# Patient Record
Sex: Female | Born: 1995 | Race: White | Hispanic: No | Marital: Single | Smoking: Never smoker
Health system: Southern US, Community
[De-identification: ages and names within clinical notes are randomized; demographics above are authoritative.]

## PROBLEM LIST (undated history)

## (undated) DIAGNOSIS — R55 Syncope and collapse: Secondary | ICD-10-CM

## (undated) DIAGNOSIS — R001 Bradycardia, unspecified: Secondary | ICD-10-CM

## (undated) DIAGNOSIS — N200 Calculus of kidney: Secondary | ICD-10-CM

## (undated) DIAGNOSIS — R110 Nausea: Secondary | ICD-10-CM

## (undated) HISTORY — DX: Syncope and collapse: R55

## (undated) HISTORY — DX: Bradycardia, unspecified: R00.1

## (undated) HISTORY — DX: Nausea: R11.0

---
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
## 2017-07-21 IMAGING — US US RENAL
1 series · 14 of 25 positions shown · non-contrast
Comparison: CT scan 01/23/2013

CLINICAL DATA: Bilateral flank pain, history of stones

EXAM:
RENAL / URINARY TRACT ULTRASOUND COMPLETE

[Series 1: us renal · 0.21mm/px · 14 of 46 slices shown]
[im 1/46]
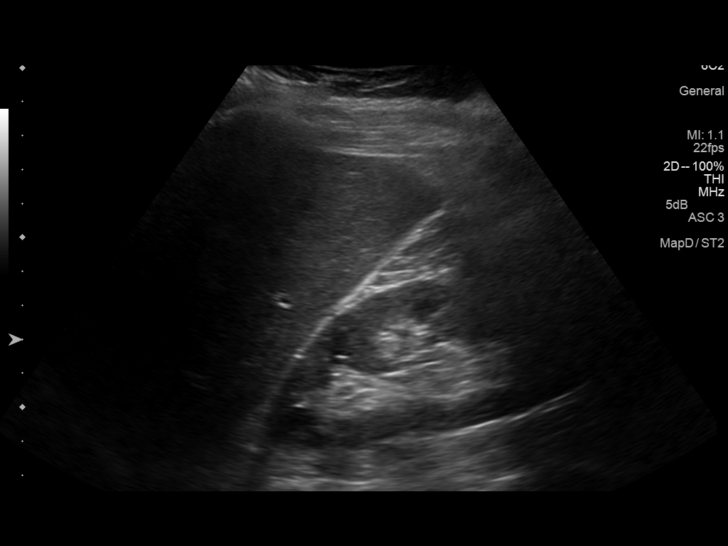
[im 4/46]
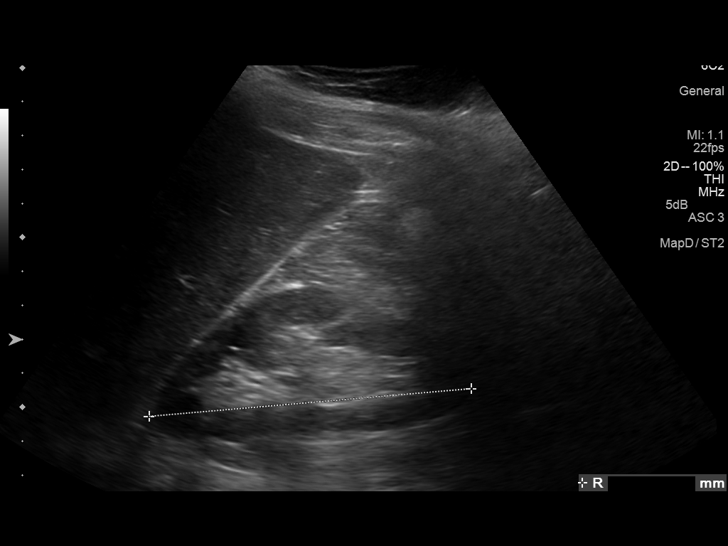
[im 8/46]
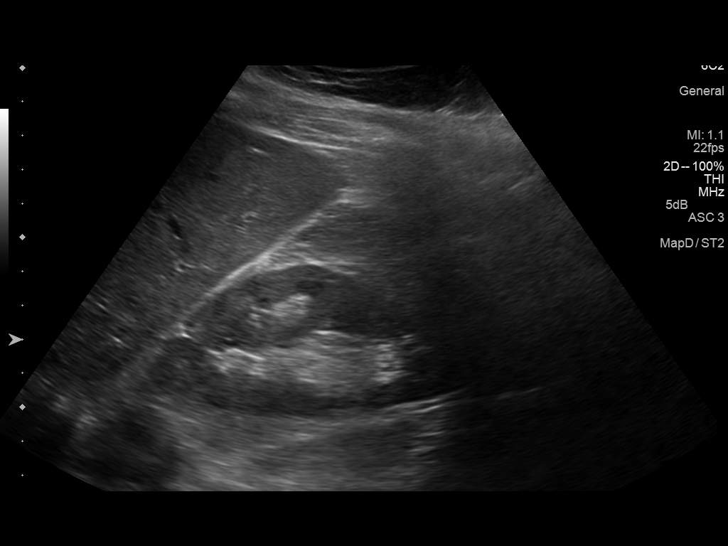
[im 12/46]
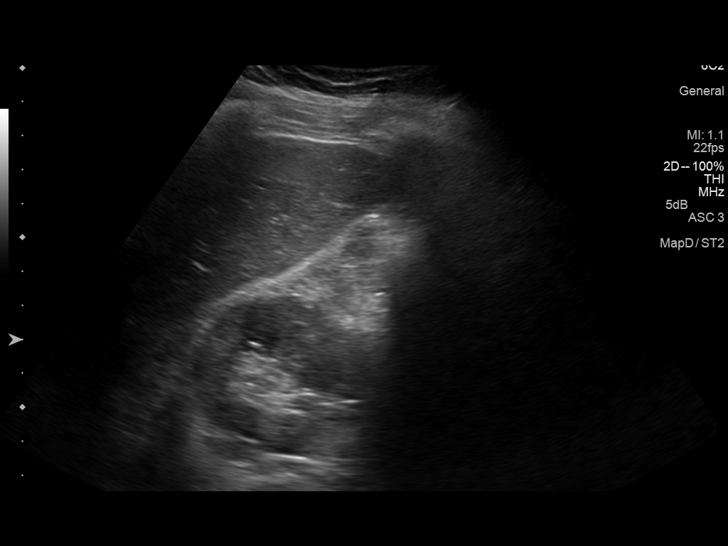
[im 16/46]
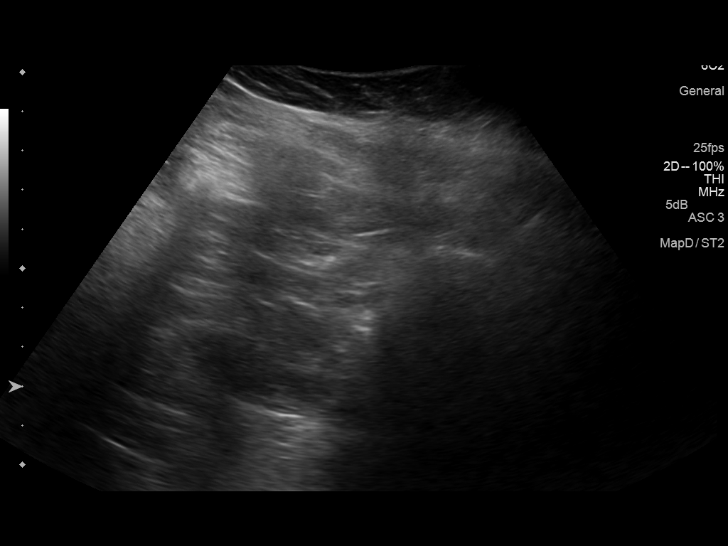
[im 17/46]
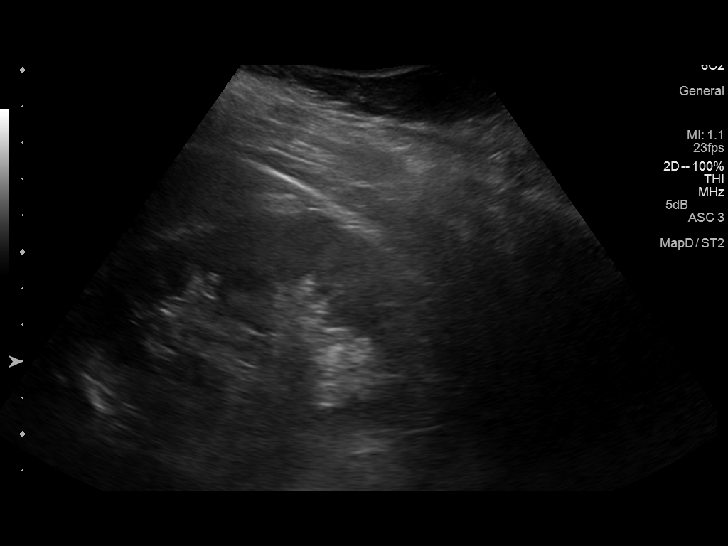
[im 21/46]
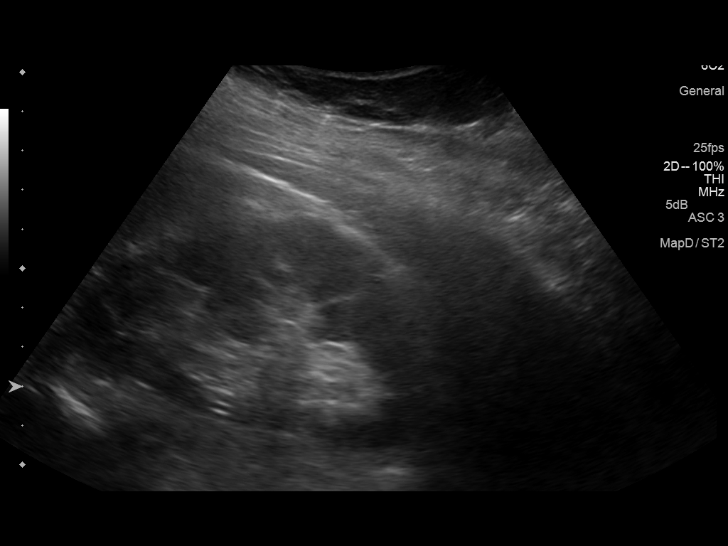
[im 25/46]
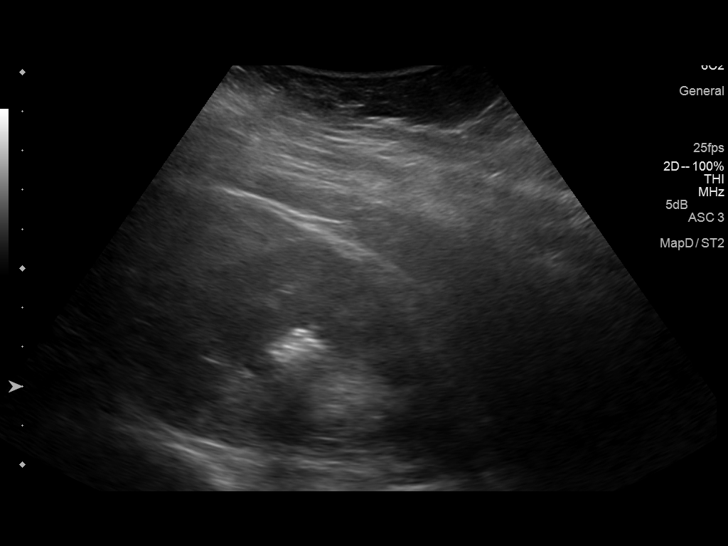
[im 29/46]
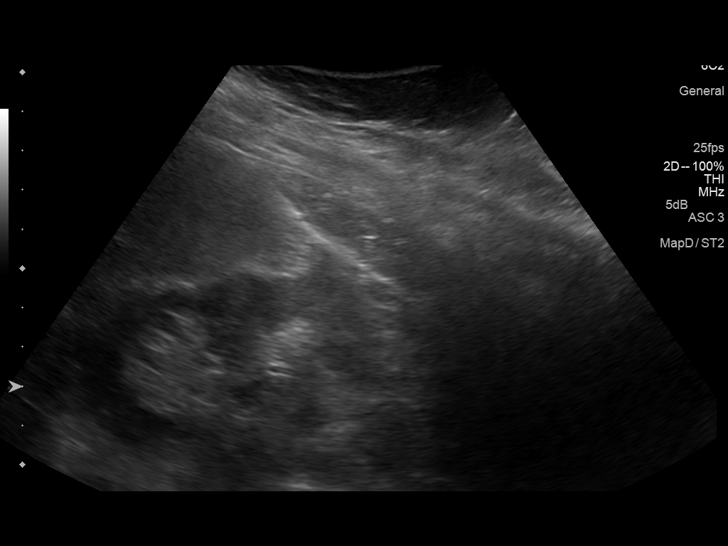
[im 31/46]
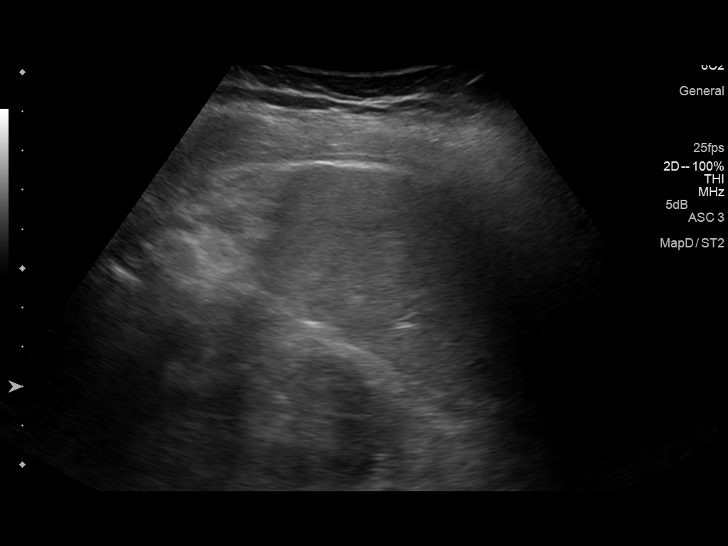
[im 34/46]
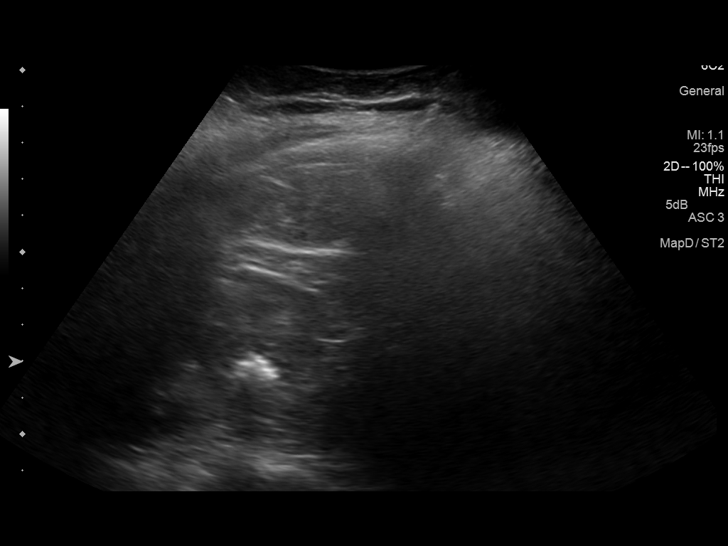
[im 38/46]
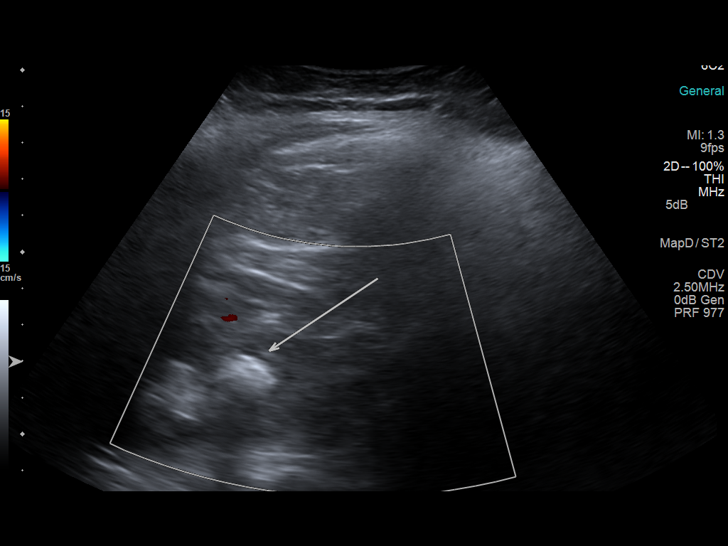
[im 42/46]
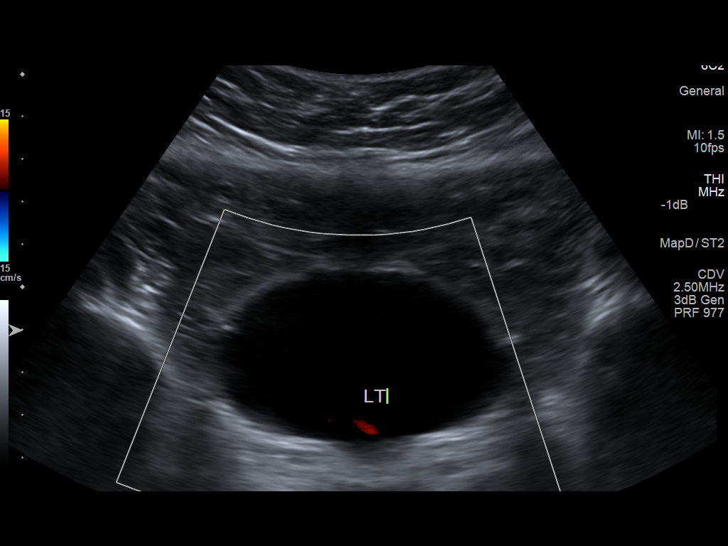
[im 46/46]
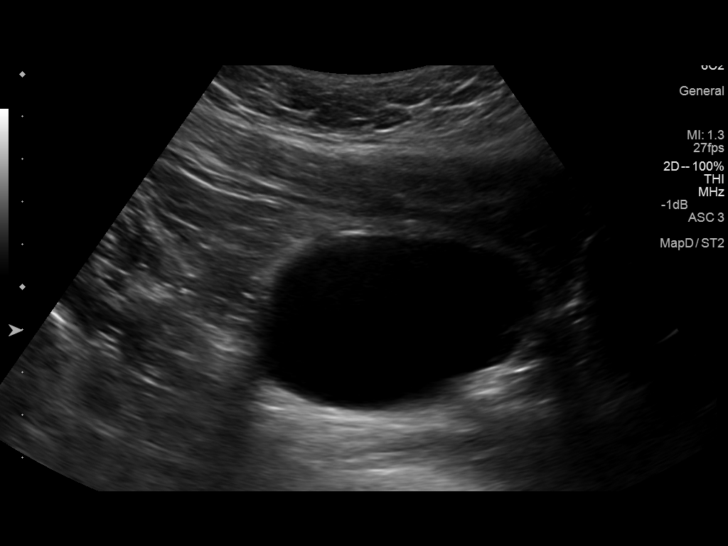

[14 of 25 positions shown; findings below may reference images not displayed]

FINDINGS: Right Kidney:

Length: 9.5 cm. Echogenicity within normal limits. No mass or
hydronephrosis visualized.

Left Kidney:

Length: 9.7 cm. Echogenicity within normal limits. No hydronephrosis
are noted. Calcified calculus midpole measures 1.4 by 1.6 cm.

Bladder:

Appears normal for degree of bladder distention.
IMPRESSION: No hydronephrosis.  Left nephrolithiasis.

## 2019-09-06 ENCOUNTER — Other Ambulatory Visit: Payer: Self-pay

## 2019-09-06 DIAGNOSIS — Z20822 Contact with and (suspected) exposure to covid-19: Secondary | ICD-10-CM

## 2019-09-08 LAB — NOVEL CORONAVIRUS, NAA: SARS-CoV-2, NAA: NOT DETECTED

## 2020-01-09 ENCOUNTER — Ambulatory Visit (INDEPENDENT_AMBULATORY_CARE_PROVIDER_SITE_OTHER): Payer: Self-pay | Admitting: Nurse Practitioner

## 2020-01-09 ENCOUNTER — Encounter: Payer: Self-pay | Admitting: Nurse Practitioner

## 2020-01-09 ENCOUNTER — Other Ambulatory Visit: Payer: Self-pay

## 2020-01-09 VITALS — BP 126/82 | HR 62 | Temp 98.2°F | Resp 18 | Ht 66.0 in | Wt 164.6 lb

## 2020-01-09 DIAGNOSIS — Z7689 Persons encountering health services in other specified circumstances: Secondary | ICD-10-CM

## 2020-01-09 DIAGNOSIS — Z13228 Encounter for screening for other metabolic disorders: Secondary | ICD-10-CM

## 2020-01-09 DIAGNOSIS — F4010 Social phobia, unspecified: Secondary | ICD-10-CM

## 2020-01-09 DIAGNOSIS — Z13220 Encounter for screening for lipoid disorders: Secondary | ICD-10-CM

## 2020-01-09 HISTORY — DX: Social phobia, unspecified: F40.10

## 2020-01-09 LAB — COMPLETE METABOLIC PANEL WITH GFR
AG Ratio: 1.8 (calc) (ref 1.0–2.5)
ALT: 9 U/L (ref 6–29)
AST: 14 U/L (ref 10–30)
Albumin: 4.4 g/dL (ref 3.6–5.1)
Alkaline phosphatase (APISO): 46 U/L (ref 31–125)
BUN: 9 mg/dL (ref 7–25)
CO2: 26 mmol/L (ref 20–32)
Calcium: 9.7 mg/dL (ref 8.6–10.2)
Chloride: 105 mmol/L (ref 98–110)
Creat: 0.72 mg/dL (ref 0.50–1.10)
GFR, Est African American: 139 mL/min/{1.73_m2} (ref >=60)
GFR, Est Non African American: 120 mL/min/{1.73_m2} (ref >=60)
Globulin: 2.5 g/dL (calc) (ref 1.9–3.7)
Glucose, Bld: 95 mg/dL (ref 65–99)
Potassium: 4.2 mmol/L (ref 3.5–5.3)
Sodium: 140 mmol/L (ref 135–146)
Total Bilirubin: 0.6 mg/dL (ref 0.2–1.2)
Total Protein: 6.9 g/dL (ref 6.1–8.1)

## 2020-01-09 LAB — LIPID PANEL
Cholesterol: 172 mg/dL (ref <200)
HDL: 69 mg/dL (ref >=50)
LDL Cholesterol (Calc): 86 mg/dL (calc)
Non-HDL Cholesterol (Calc): 103 mg/dL (calc) (ref <130)
Total CHOL/HDL Ratio: 2.5 (calc) (ref <5.0)
Triglycerides: 78 mg/dL (ref <150)

## 2020-01-09 LAB — CBC WITH DIFFERENTIAL/PLATELET
Absolute Monocytes: 459 cells/uL (ref 200–950)
Basophils Absolute: 50 cells/uL (ref 0–200)
Basophils Relative: 0.8 %
Eosinophils Absolute: 198 cells/uL (ref 15–500)
Eosinophils Relative: 3.2 %
HCT: 40.4 % (ref 35.0–45.0)
Hemoglobin: 13.6 g/dL (ref 11.7–15.5)
Lymphs Abs: 1810 cells/uL (ref 850–3900)
MCH: 30.6 pg (ref 27.0–33.0)
MCHC: 33.7 g/dL (ref 32.0–36.0)
MCV: 91 fL (ref 80.0–100.0)
MPV: 12.6 fL — ABNORMAL HIGH (ref 7.5–12.5)
Monocytes Relative: 7.4 %
Neutro Abs: 3683 cells/uL (ref 1500–7800)
Neutrophils Relative %: 59.4 %
Platelets: 288 10*3/uL (ref 140–400)
RBC: 4.44 10*6/uL (ref 3.80–5.10)
RDW: 11.9 % (ref 11.0–15.0)
Total Lymphocyte: 29.2 %
WBC: 6.2 10*3/uL (ref 3.8–10.8)

## 2020-01-09 LAB — PREGNANCY, URINE: Preg Test, Ur: NEGATIVE

## 2020-01-09 MED ORDER — SERTRALINE HCL 25 MG PO TABS: 12.5000 mg | ORAL_TABLET | Freq: Every day | ORAL | 1 refills | Status: DC

## 2020-01-09 NOTE — Progress Notes (Signed)
New Patient Office Visit  Subjective:  Patient ID: Robin Cannon, female    DOB: 1996/09/28  Age: 24 y.o. MRN: [id number]  CC: Establish Care  HPI Robin Cannon is a 24 year old caucasian female presenting to establish care. She reports that she is a healthy young female tries to follow healthy diet and exercise without chronic health issues other than h/o fx kidney stones in child. Discussed if occurs directing to strain urine to collect stone for lab if possible. She denied sexually active ever, no birth control, [facility] 12/31/2019. Menstrual cycles normal, normal daily BM. Never had PAP. Discussed screening protocol, pt to consider whne ready. She does not drive or have driver license. Pt asked for mom to come into exam room. Reported by pt that she has social anxiety. When she must be around persons that she does not know she becomes very anxious. Denied depression only anxiety as stated. No txs ever tried. She would like treatment. She has never met her dad but he has reported h/o DM. No cp/ct, gu/gi sxs, sob, pain, edema.   Past Medical History:  Diagnosis Date  . Kidney stone   . Kidney stones   . Nausea   . Social anxiety disorder 01/09/2020    No past surgical history on file.  Family History  Problem Relation Age of Onset  . Diabetes Father     Social History   Socioeconomic History  . Marital status: Single    Spouse name: Not on file  . Number of children: Not on file  . Years of education: Not on file  . Highest education level: Not on file  Occupational History  . Not on file  Tobacco Use  . Smoking status: Never Smoker  Substance and Sexual Activity  . Alcohol use: No  . Drug use: No  . Sexual activity: Never  Other Topics Concern  . Not on file  Social History Narrative  . Not on file   Social Determinants of Health   Financial Resource Strain:   . Difficulty of Paying Living Expenses: Not on file  Food Insecurity:   . Worried About Sales executive in the Last Year: Not on file  . Ran Out of Food in the Last Year: Not on file  Transportation Needs:   . Lack of Transportation (Medical): Not on file  . Lack of Transportation (Non-Medical): Not on file  Physical Activity:   . Days of Exercise per Week: Not on file  . Minutes of Exercise per Session: Not on file  Stress:   . Feeling of Stress : Not on file  Social Connections:   . Frequency of Communication with Friends and Family: Not on file  . Frequency of Social Gatherings with Friends and Family: Not on file  . Attends Religious Services: Not on file  . Active Member of Clubs or Organizations: Not on file  . Attends Archivist Meetings: Not on file  . Marital Status: Not on file  Intimate Partner Violence:   . Fear of Current or Ex-Partner: Not on file  . Emotionally Abused: Not on file  . Physically Abused: Not on file  . Sexually Abused: Not on file    ROS Review of Systems  All other systems reviewed and are negative.   Objective:   Today's Vitals: BP 126/82 (BP Location: Left Arm, Patient Position: Sitting, Cuff Size: Normal)   Pulse 62   Temp 98.2 F (36.8 C) (Oral)  Resp 18   Ht '5\' 6"'$  (1.676 m)   Wt 164 lb 9.6 oz (74.7 kg)   LMP 12/31/2019   SpO2 99%   BMI 26.57 kg/m   Physical Exam Vitals and nursing note reviewed.  Constitutional:      Appearance: Normal appearance. She is well-developed and well-groomed.  HENT:     Head: Normocephalic.     Right Ear: Tympanic membrane, ear canal and external ear normal.     Left Ear: Tympanic membrane, ear canal and external ear normal.     Nose: Nose normal.     Mouth/Throat:     Lips: Pink.     Mouth: Mucous membranes are moist.     Dentition: Normal dentition.     Tongue: No lesions. Tongue does not deviate from midline.     Pharynx: Oropharynx is clear.  Eyes:     General: Lids are normal. Lids are everted, no foreign bodies appreciated.     Extraocular Movements: Extraocular movements  intact.     Conjunctiva/sclera: Conjunctivae normal.     Pupils: Pupils are equal, round, and reactive to light.  Neck:     Thyroid: No thyroid mass, thyromegaly or thyroid tenderness.     Vascular: No carotid bruit or JVD.  Cardiovascular:     Rate and Rhythm: Normal rate and regular rhythm.     Pulses: Normal pulses.     Heart sounds: Normal heart sounds, S1 normal and S2 normal.  Pulmonary:     Effort: Pulmonary effort is normal.     Breath sounds: Normal breath sounds.  Abdominal:     General: Abdomen is flat. Bowel sounds are normal.     Palpations: Abdomen is soft.     Tenderness: There is no abdominal tenderness.  Musculoskeletal:        General: Normal range of motion.     Cervical back: Normal range of motion and neck supple.     Right lower leg: No edema.     Left lower leg: No edema.  Lymphadenopathy:     Head:     Right side of head: No submental, submandibular, tonsillar, preauricular, posterior auricular or occipital adenopathy.     Left side of head: No submental, submandibular, tonsillar, preauricular, posterior auricular or occipital adenopathy.     Cervical: No cervical adenopathy.  Skin:    General: Skin is warm and dry.     Capillary Refill: Capillary refill takes less than 2 seconds.  Neurological:     General: No focal deficit present.     Mental Status: She is alert and oriented to person, place, and time.  Psychiatric:        Attention and Perception: Attention and perception normal.        Mood and Affect: Mood is anxious. Mood is not depressed.        Speech: Speech normal.        Behavior: Behavior normal. Behavior is cooperative.        Thought Content: Thought content normal.        Cognition and Memory: Cognition and memory normal.        Judgment: Judgment normal.     Assessment & Plan:  You have establish care with clinic/provider Make Annual wellness examination with in next six months close to your birth month if possible Labs today for  evaluation Referral to psychology for counseling for social anxiety Will initiate treatment for SA today and follow up in 4 weeks. Unable to  void in clinic will bring urine back to office to r/o pregnancy r/t starting SSRI treament  Problem List Items Addressed This Visit      Other   Social anxiety disorder   Relevant Medications   sertraline (ZOLOFT) 25 MG tablet   Other Relevant Orders   Pregnancy, urine   Ambulatory referral to Psychology    Other Visit Diagnoses    Encounter to establish care    -  Primary   Relevant Orders   Lipid panel   CBC with Differential/Platelet   COMPLETE METABOLIC PANEL WITH GFR   Pregnancy, urine   Screening, lipid       Relevant Orders   Lipid panel   Screening for metabolic disorder       Relevant Orders   CBC with Differential/Platelet   COMPLETE METABOLIC PANEL WITH GFR      Outpatient Encounter Medications as of 01/09/2020  Medication Sig  . cetirizine (ZYRTEC) 10 MG tablet Take 10 mg by mouth daily as needed for allergies.  . polyethylene glycol (MIRALAX / GLYCOLAX) packet Take 17 g by mouth daily as needed (Constipation).  . sertraline (ZOLOFT) 25 MG tablet Take 0.5 tablets (12.5 mg total) by mouth daily.   No facility-administered encounter medications on file as of 01/09/2020.    Follow-up: Return in about 4 weeks (around 02/06/2020), or for anxiety after treatment started.   Annie Main, FNP

## 2020-01-13 ENCOUNTER — Encounter: Payer: Self-pay | Admitting: Nurse Practitioner

## 2020-01-13 NOTE — Progress Notes (Signed)
Normal labs.

## 2020-02-06 ENCOUNTER — Other Ambulatory Visit: Payer: Self-pay

## 2020-02-06 ENCOUNTER — Ambulatory Visit (INDEPENDENT_AMBULATORY_CARE_PROVIDER_SITE_OTHER): Payer: Self-pay | Admitting: Nurse Practitioner

## 2020-02-06 VITALS — BP 102/68 | HR 68 | Temp 97.6°F | Resp 18 | Ht 66.0 in | Wt 168.0 lb

## 2020-02-06 DIAGNOSIS — F4010 Social phobia, unspecified: Secondary | ICD-10-CM

## 2020-02-06 DIAGNOSIS — Z09 Encounter for follow-up examination after completed treatment for conditions other than malignant neoplasm: Secondary | ICD-10-CM

## 2020-02-06 MED ORDER — SERTRALINE HCL 25 MG PO TABS: 25.0000 mg | ORAL_TABLET | Freq: Every day | ORAL | 0 refills | Status: DC

## 2020-02-06 NOTE — Progress Notes (Addendum)
New Patient Office Visit  Subjective:  Patient ID: Robin Cannon, female    DOB: Apr 01, 1996  Age: 24 y.o. MRN: [id number]  CC: Establish Care  HPI Robin Cannon is a 24 year old caucasian female who established care with this provider on [id number]. At that visit she reported that she had experienced social anxiety. When she must be around persons that she does not know she becomes very anxious. She denied depression, had never tried tx, and desired.   Today 02/06/2020 she is here for her 4 wk follow up after initiating a low dose SSRI. She was referred to psychiatry and instructed to follow up with them if an appointment could be completed by the 4-6 week after starting tx. Pt states that her mom can notice a difference in her anxiety around others however the pt denied noticing a difference. She did not make an apt with psychiatry not having insurance and knowing she had a 4 wk f/u at this clinic. Re-enforced to make apt with psychiatry for her next 4 week f/u for increased dose evaluation to include counseling/therapy however return to this provider only if she is unable to get an apt out further than 6 weeks. She v/l u/s.   No cp/ct, gu/gi sxs, sob, pain, edema.   Past Medical History:  Diagnosis Date  . Kidney stone   . Kidney stones   . Nausea   . Social anxiety disorder 01/09/2020    No past surgical history on file.  Family History  Problem Relation Age of Onset  . Diabetes Father     Social History   Socioeconomic History  . Marital status: Single    Spouse name: Not on file  . Number of children: Not on file  . Years of education: Not on file  . Highest education level: Not on file  Occupational History  . Not on file  Tobacco Use  . Smoking status: Never Smoker  Substance and Sexual Activity  . Alcohol use: Never  . Drug use: Never  . Sexual activity: Never  Other Topics Concern  . Not on file  Social History Narrative  . Not on file   Social  Determinants of Health   Financial Resource Strain:   . Difficulty of Paying Living Expenses:   Food Insecurity:   . Worried About Programme researcher, broadcasting/film/video in the Last Year:   . Barista in the Last Year:   Transportation Needs:   . Freight forwarder (Medical):   Marland Kitchen Lack of Transportation (Non-Medical):   Physical Activity:   . Days of Exercise per Week:   . Minutes of Exercise per Session:   Stress:   . Feeling of Stress :   Social Connections:   . Frequency of Communication with Friends and Family:   . Frequency of Social Gatherings with Friends and Family:   . Attends Religious Services:   . Active Member of Clubs or Organizations:   . Attends Banker Meetings:   Marland Kitchen Marital Status:   Intimate Partner Violence:   . Fear of Current or Ex-Partner:   . Emotionally Abused:   Marland Kitchen Physically Abused:   . Sexually Abused:    Current Outpatient Medications on File Prior to Visit  Medication Sig Dispense Refill  . cetirizine (ZYRTEC) 10 MG tablet Take 10 mg by mouth daily as needed for allergies.    . polyethylene glycol (MIRALAX / GLYCOLAX) packet Take 17 g by mouth daily as  needed (Constipation).     No current facility-administered medications on file prior to visit.   No Known Allergies  There is no immunization history on file for this patient.  ROS Review of Systems  All other systems reviewed and are negative.   Objective:   Today's Vitals: BP 102/68 (BP Location: Left Arm, Patient Position: Sitting, Cuff Size: Normal)   Pulse 68   Temp 97.6 F (36.4 C) (Temporal)   Resp 18   Ht 5\' 6"  (1.676 m)   Wt 168 lb (76.2 kg)   LMP 12/31/2019   SpO2 98%   BMI 27.12 kg/m   Physical Exam Vitals and nursing note reviewed.  Constitutional:      Appearance: Normal appearance. She is well-developed and well-groomed.  HENT:     Head: Normocephalic.     Right Ear: Tympanic membrane, ear canal and external ear normal.     Left Ear: Tympanic membrane, ear  canal and external ear normal.     Nose: Nose normal.     Mouth/Throat:     Lips: Pink.     Mouth: Mucous membranes are moist.     Dentition: Normal dentition.     Tongue: No lesions. Tongue does not deviate from midline.     Pharynx: Oropharynx is clear.  Eyes:     General: Lids are normal. Lids are everted, no foreign bodies appreciated.     Extraocular Movements: Extraocular movements intact.     Conjunctiva/sclera: Conjunctivae normal.     Pupils: Pupils are equal, round, and reactive to light.  Neck:     Thyroid: No thyroid mass, thyromegaly or thyroid tenderness.     Vascular: No carotid bruit or JVD.  Cardiovascular:     Rate and Rhythm: Normal rate and regular rhythm.     Pulses: Normal pulses.     Heart sounds: Normal heart sounds, S1 normal and S2 normal.  Pulmonary:     Effort: Pulmonary effort is normal.     Breath sounds: Normal breath sounds.  Abdominal:     General: Abdomen is flat. Bowel sounds are normal.     Palpations: Abdomen is soft.     Tenderness: There is no abdominal tenderness.  Musculoskeletal:        General: Normal range of motion.     Cervical back: Normal range of motion and neck supple.     Right lower leg: No edema.     Left lower leg: No edema.  Lymphadenopathy:     Head:     Right side of head: No submental, submandibular, tonsillar, preauricular, posterior auricular or occipital adenopathy.     Left side of head: No submental, submandibular, tonsillar, preauricular, posterior auricular or occipital adenopathy.     Cervical: No cervical adenopathy.  Skin:    General: Skin is warm and dry.     Capillary Refill: Capillary refill takes less than 2 seconds.  Neurological:     General: No focal deficit present.     Mental Status: She is alert and oriented to person, place, and time.  Psychiatric:        Attention and Perception: Attention and perception normal.        Mood and Affect: Mood is anxious. Mood is not depressed.        Speech:  Speech normal.        Behavior: Behavior normal. Behavior is cooperative.        Thought Content: Thought content normal.  Cognition and Memory: Cognition and memory normal.        Judgment: Judgment normal.     Assessment & Plan:  Make Annual wellness examination annually. Make your next follow up apt with psychiatry for 4-6 weeks for counseling, establish care, and dose increased of SSRI today.   Problem List Items Addressed This Visit      Other   Social anxiety disorder - Primary   Relevant Medications   sertraline (ZOLOFT) 25 MG tablet    Other Visit Diagnoses    Follow-up exam after treatment          Outpatient Encounter Medications as of 02/06/2020  Medication Sig  . cetirizine (ZYRTEC) 10 MG tablet Take 10 mg by mouth daily as needed for allergies.  . polyethylene glycol (MIRALAX / GLYCOLAX) packet Take 17 g by mouth daily as needed (Constipation).  . sertraline (ZOLOFT) 25 MG tablet Take 1 tablet (25 mg total) by mouth daily.  . [DISCONTINUED] sertraline (ZOLOFT) 25 MG tablet Take 0.5 tablets (12.5 mg total) by mouth daily.   No facility-administered encounter medications on file as of 02/06/2020.    Follow-up: Return in about 1 year (around 02/05/2021) for ape .   Annie Main, FNP

## 2020-02-14 ENCOUNTER — Ambulatory Visit: Payer: BLUE CROSS/BLUE SHIELD

## 2020-02-29 ENCOUNTER — Other Ambulatory Visit: Payer: Self-pay | Admitting: Nurse Practitioner

## 2020-02-29 DIAGNOSIS — F4010 Social phobia, unspecified: Secondary | ICD-10-CM

## 2020-03-02 ENCOUNTER — Other Ambulatory Visit: Payer: Self-pay | Admitting: Nurse Practitioner

## 2020-03-02 ENCOUNTER — Telehealth: Payer: Self-pay | Admitting: Nurse Practitioner

## 2020-03-02 NOTE — Telephone Encounter (Signed)
#  [phone]  NP(Crystal) referred her to a psychiatry not able to make appointment due to insurance she would be self pay  to schedule with the psychiatry will be over $300.00 wasn't sure what to do next.

## 2020-03-02 NOTE — Telephone Encounter (Signed)
See note

## 2020-03-02 NOTE — Telephone Encounter (Signed)
Robin Cannon,  Can you take a look at this case. She needs counseling for sure to assure that she is getting the very best treatment for her social anxiety. Can you do your magic?

## 2020-03-03 NOTE — Telephone Encounter (Signed)
Robin Cannon,  Please contact the hospitals SW/CM department and ask for resources for this young lady to get counseling. I feel that medication alone is not going to be enough.

## 2020-03-03 NOTE — Telephone Encounter (Signed)
Unfortunately due to the patient not having insurance counseling for her is going to be expensive and I don't have a work around that.  I have found that if patient's with insurance have a high out of pocket cost. Not sure if she has an employer that has a EACP program or if her parents have this option for all people in their residence.

## 2020-03-03 NOTE — Telephone Encounter (Signed)
Spoke with pt and she states she does not have insurance. Pt has Medicaid-Family Planning, only used for contraceptives or pregnancy.

## 2020-03-03 NOTE — Telephone Encounter (Signed)
Marylene Land,  Please explore with mom about insurance/medicaid??

## 2020-03-09 NOTE — Telephone Encounter (Signed)
Left message return call

## 2020-03-09 NOTE — Telephone Encounter (Signed)
Thank you Carollee Herter for looking into. Will you let her and mom (if she desires) know this information. I highly recommend them checking first with DSS Medicaid worker assigned to her now to see if she qualifies based on the new diagnoses for Medicaid to cover counseling and treatment.

## 2020-03-09 NOTE — Telephone Encounter (Signed)
So I called over to [facility] and they gave me the number to [facility] for the patient. It will still be some out pocket cost for her but that was the only resource that they had for [facility] as far as Robin Cannon I was told that there is [facility] and they have a sliding scale for uninsured patient's but she will need to contact them to get scheduled. They also recommended that she contact her social worker to see if she qualifies for regular [place] medicaid which would help her get free services.

## 2020-03-12 NOTE — Telephone Encounter (Signed)
Left message return call

## 2020-03-17 NOTE — Telephone Encounter (Signed)
Spoke with patient and informed her that we recommend she contact DSS and her Child psychotherapist and inquire about medicaid. Patient verbalized understanding but stated that she feels that the Zoloft is working so she does not feel she needs counseling at this time.

## 2020-03-31 ENCOUNTER — Other Ambulatory Visit: Payer: Self-pay | Admitting: Nurse Practitioner

## 2020-03-31 DIAGNOSIS — F4010 Social phobia, unspecified: Secondary | ICD-10-CM

## 2020-04-30 ENCOUNTER — Other Ambulatory Visit: Payer: Self-pay | Admitting: Nurse Practitioner

## 2020-04-30 DIAGNOSIS — F4010 Social phobia, unspecified: Secondary | ICD-10-CM

## 2020-04-30 NOTE — Telephone Encounter (Signed)
Requested Prescriptions   Pending Prescriptions Disp Refills  . sertraline (ZOLOFT) 25 MG tablet [Pharmacy Med Name: SERTRALINE 25MG  TABLETS] 30 tablet 0    Sig: TAKE 1 TABLET(25 MG) BY MOUTH DAILY     Pt has not be to psychology.They called her 3 times with no call backs. Do you want to fill this?

## 2020-05-02 ENCOUNTER — Encounter: Payer: Self-pay | Admitting: Nurse Practitioner

## 2020-05-19 ENCOUNTER — Other Ambulatory Visit: Payer: Self-pay | Admitting: Nurse Practitioner

## 2020-05-19 ENCOUNTER — Encounter: Payer: Self-pay | Admitting: Nurse Practitioner

## 2020-05-19 DIAGNOSIS — F4010 Social phobia, unspecified: Secondary | ICD-10-CM

## 2020-05-19 MED ORDER — SERTRALINE HCL 25 MG PO TABS: 25.0000 mg | ORAL_TABLET | Freq: Every day | ORAL | 0 refills | Status: DC

## 2021-05-17 ENCOUNTER — Ambulatory Visit (INDEPENDENT_AMBULATORY_CARE_PROVIDER_SITE_OTHER): Payer: Self-pay

## 2021-05-17 ENCOUNTER — Ambulatory Visit
Admission: EM | Admit: 2021-05-17 | Discharge: 2021-05-17 | Disposition: A | Discharge status: Home / Self Care | Payer: BLUE CROSS/BLUE SHIELD | Attending: Family Medicine | Admitting: Family Medicine

## 2021-05-17 ENCOUNTER — Encounter: Payer: Self-pay | Admitting: Emergency Medicine

## 2021-05-17 DIAGNOSIS — S46912A Strain of unspecified muscle, fascia and tendon at shoulder and upper arm level, left arm, initial encounter: Secondary | ICD-10-CM

## 2021-05-17 DIAGNOSIS — M25512 Pain in left shoulder: Secondary | ICD-10-CM

## 2021-05-17 MED ORDER — NAPROXEN 375 MG PO TABS: 375.0000 mg | ORAL_TABLET | Freq: Two times a day (BID) | ORAL | 0 refills | Status: DC

## 2021-05-17 MED ORDER — TIZANIDINE HCL 4 MG PO TABS: 2.0000 mg | ORAL_TABLET | Freq: Four times a day (QID) | ORAL | 0 refills | Status: DC | PRN

## 2021-05-17 NOTE — ED Triage Notes (Signed)
Was moving furniture on Saturday.  Left shoulder pain and mid neck pain that started Saturday afternoon.

## 2021-05-18 NOTE — ED Provider Notes (Signed)
[facility]    CSN: [id number] Arrival date & time: 05/17/21  1535      History   Chief Complaint No chief complaint on file.   HPI Robin Cannon is a 25 y.o. female.   HPI Patient presents with left shoulder pain with mid neck pain which started 2 days ago after lifting furniture. She has taken OTC medication without relief. No prior injury.    Past Medical History:  Diagnosis Date   Kidney stone    Kidney stones    Nausea    Social anxiety disorder 01/09/2020    Patient Active Problem List   Diagnosis Date Noted   Social anxiety disorder 01/09/2020    History reviewed. No pertinent surgical history.  OB History   No obstetric history on file.      Home Medications    Prior to Admission medications   Medication Sig Start Date End Date Taking? Authorizing Provider  naproxen (NAPROSYN) 375 MG tablet Take 1 tablet (375 mg total) by mouth 2 (two) times daily. 05/17/21  Yes Bing Neighbors, FNP  tiZANidine (ZANAFLEX) 4 MG tablet Take 0.5-1 tablets (2-4 mg total) by mouth every 6 (six) hours as needed for muscle spasms. 05/17/21  Yes Bing Neighbors, FNP  cetirizine (ZYRTEC) 10 MG tablet Take 10 mg by mouth daily as needed for allergies.    [provider]  polyethylene glycol (MIRALAX / GLYCOLAX) packet Take 17 g by mouth daily as needed (Constipation).    [provider]  sertraline (ZOLOFT) 25 MG tablet Take 1 tablet (25 mg total) by mouth daily for 15 days. 05/19/20 06/03/20  Elmore Guise, FNP    Family History Family History  Problem Relation Age of Onset   Diabetes Father     Social History Social History   Tobacco Use   Smoking status: Never   Smokeless tobacco: Never  Vaping Use   Vaping Use: Never used  Substance Use Topics   Alcohol use: Never   Drug use: Never     Allergies   Patient has no known allergies.   Review of Systems Review of Systems Pertinent negatives listed in HPI  Physical  Exam Triage Vital Signs ED Triage Vitals  Enc Vitals Group     BP 05/17/21 1719 139/79     Pulse Rate 05/17/21 1719 89     Resp 05/17/21 1719 16     Temp 05/17/21 1719 97.7 F (36.5 C)     Temp Source 05/17/21 1719 Temporal     SpO2 05/17/21 1719 99 %     Weight --      Height --      Head Circumference --      Peak Flow --      Pain Score 05/17/21 1720 7     Pain Loc --      Pain Edu? --      Excl. in GC? --    No data found.  Updated Vital Signs BP 139/79 (BP Location: Right Arm)   Pulse 89   Temp 97.7 F (36.5 C) (Temporal)   Resp 16   LMP 04/19/2021 (Approximate)   SpO2 99%   Visual Acuity Right Eye Distance:   Left Eye Distance:   Bilateral Distance:    Right Eye Near:   Left Eye Near:    Bilateral Near:     Physical Exam General appearance: Alert, well developed, well nourished, cooperative and  no distress Head: Normocephalic, without obvious  abnormality, atraumatic Respiratory: Respirations even and unlabored, normal respiratory rate Heart: Rate and rhythm normal. No gallop or murmurs noted on exam  Extremities: Left shoulder tenderness full ROM with tenderness  Skin: Skin color, texture, turgor normal. No rashes seen  Psych: Appropriate mood and affect. Neurologic: No neurological focal deficits  UC Treatments / Results  Labs (all labs ordered are listed, but only abnormal results are displayed) Labs Reviewed - No data to display  EKG   Radiology DG Shoulder Left  Result Date: 05/17/2021 CLINICAL DATA:  Shoulder pain after injury EXAM: LEFT SHOULDER - 2+ VIEW COMPARISON:  None. FINDINGS: There is no evidence of fracture or dislocation. There is no evidence of arthropathy or other focal bone abnormality. Soft tissues are unremarkable. IMPRESSION: No acute osseous abnormality. Electronically Signed   By: Maudry Mayhew MD   On: 05/17/2021 18:41    Procedures Procedures (including critical care time)  Medications Ordered in UC Medications - No  data to display  Initial Impression / Assessment and Plan / UC Course  I have reviewed the triage vital signs and the nursing notes.  Pertinent labs & imaging results that were available during my care of the patient were reviewed by me and considered in my medical decision making (see chart for details).    Left shoulder strain injury. No acute fracture or misalignment. Naproxen and tizanidine.  Sports medicine as needed if symptoms do not improve.  Final Clinical Impressions(s) / UC Diagnoses   Final diagnoses:  Left shoulder strain, initial encounter   Discharge Instructions   None    ED Prescriptions     Medication Sig Dispense Auth. Provider   naproxen (NAPROSYN) 375 MG tablet Take 1 tablet (375 mg total) by mouth 2 (two) times daily. 20 tablet Bing Neighbors, FNP   tiZANidine (ZANAFLEX) 4 MG tablet Take 0.5-1 tablets (2-4 mg total) by mouth every 6 (six) hours as needed for muscle spasms. 20 tablet Bing Neighbors, FNP      PDMP not reviewed this encounter.   Bing Neighbors, FNP 05/22/21 1737

## 2021-06-21 ENCOUNTER — Encounter: Payer: Self-pay | Admitting: Emergency Medicine

## 2021-06-21 ENCOUNTER — Other Ambulatory Visit: Payer: Self-pay

## 2021-06-21 ENCOUNTER — Ambulatory Visit: Payer: Medicaid Other

## 2021-06-21 ENCOUNTER — Ambulatory Visit
Admission: EM | Admit: 2021-06-21 | Discharge: 2021-06-21 | Disposition: A | Discharge status: Home / Self Care | Payer: Medicaid Other | Attending: Family Medicine | Admitting: Family Medicine

## 2021-06-21 DIAGNOSIS — R21 Rash and other nonspecific skin eruption: Secondary | ICD-10-CM

## 2021-06-21 MED ORDER — DEXAMETHASONE SODIUM PHOSPHATE 10 MG/ML IJ SOLN
10.0000 mg | Freq: Once | INTRAMUSCULAR | Status: AC
  Administered 2021-06-21: 10 mg via INTRAMUSCULAR

## 2021-06-21 MED ORDER — TRIAMCINOLONE ACETONIDE 0.025 % EX OINT: 1.0000 "application " | TOPICAL_OINTMENT | Freq: Two times a day (BID) | CUTANEOUS | 0 refills | Status: DC

## 2021-06-21 NOTE — ED Triage Notes (Signed)
Itchy rash that looks like red lines that pops up and goes away on various areas of her body x 1week.  Denies any new products or detergents.

## 2021-06-21 NOTE — Discharge Instructions (Addendum)
Resume Xyzal. Apply triamcinolone twice daily to affected area as needed. Follow-up allergist for more definitive testing.

## 2021-06-21 NOTE — ED Provider Notes (Signed)
[facility]    CSN: [id number] Arrival date & time: 06/21/21  1550      History   Chief Complaint Chief Complaint  Patient presents with   Rash    HPI Robin Cannon is a 25 y.o. female.   HPI Patient presents today for evaluation of itchy pruritic rash eruptions occurring in various areas of the body x 1 week. No recent changes in food intake, detergents, lotions or soaps. The red streaks and urticarial pattern skin eruption occur intermittently.  This has never occurred previously. The skin is itchy when rash erupts and resolves without intervention.  Past Medical History:  Diagnosis Date   Kidney stone    Kidney stones    Nausea    Social anxiety disorder 01/09/2020    Patient Active Problem List   Diagnosis Date Noted   Social anxiety disorder 01/09/2020    History reviewed. No pertinent surgical history.  OB History   No obstetric history on file.      Home Medications    Prior to Admission medications   Medication Sig Start Date End Date Taking? Authorizing Provider  triamcinolone (KENALOG) 0.025 % ointment Apply 1 application topically 2 (two) times daily. 06/21/21  Yes Bing Neighbors, FNP  cetirizine (ZYRTEC) 10 MG tablet Take 10 mg by mouth daily as needed for allergies.    [provider]  naproxen (NAPROSYN) 375 MG tablet Take 1 tablet (375 mg total) by mouth 2 (two) times daily. 05/17/21   Bing Neighbors, FNP  polyethylene glycol [facility] Ethelene Hal) packet Take 17 g by mouth daily as needed (Constipation).    [provider]  sertraline (ZOLOFT) 25 MG tablet Take 1 tablet (25 mg total) by mouth daily for 15 days. 05/19/20 06/03/20  Elmore Guise, FNP  tiZANidine (ZANAFLEX) 4 MG tablet Take 0.5-1 tablets (2-4 mg total) by mouth every 6 (six) hours as needed for muscle spasms. 05/17/21   Bing Neighbors, FNP    Family History Family History  Problem Relation Age of Onset   Diabetes Father     Social  History Social History   Tobacco Use   Smoking status: Never   Smokeless tobacco: Never  Vaping Use   Vaping Use: Never used  Substance Use Topics   Alcohol use: Never   Drug use: Never     Allergies   Patient has no known allergies.   Review of Systems Review of Systems Pertinent negatives listed in HPI  Physical Exam Triage Vital Signs ED Triage Vitals  Enc Vitals Group     BP 06/21/21 1711 116/79     Pulse Rate 06/21/21 1711 68     Resp 06/21/21 1711 16     Temp 06/21/21 1711 98.4 F (36.9 C)     Temp Source 06/21/21 1711 Tympanic     SpO2 06/21/21 1711 99 %     Weight --      Height --      Head Circumference --      Peak Flow --      Pain Score 06/21/21 1718 0     Pain Loc --      Pain Edu? --      Excl. in GC? --    No data found.  Updated Vital Signs BP 116/79 (BP Location: Right Arm)   Pulse 68   Temp 98.4 F (36.9 C) (Tympanic)   Resp 16   LMP 06/08/2021   SpO2 99%  Visual Acuity Right Eye Distance:   Left Eye Distance:   Bilateral Distance:    Right Eye Near:   Left Eye Near:    Bilateral Near:     Physical Exam   UC Treatments / Results  Labs (all labs ordered are listed, but only abnormal results are displayed) Labs Reviewed - No data to display  EKG   Radiology No results found.  Procedures Procedures (including critical care time)  Medications Ordered in UC Medications  dexamethasone (DECADRON) injection 10 mg (has no administration in time range)    Initial Impression / Assessment and Plan / UC Course  I have reviewed the triage vital signs and the nursing notes.  Pertinent labs & imaging results that were available during my care of the patient were reviewed by me and considered in my medical decision making (see chart for details).    Localized skin eruption Resume Xyzal. Apply triamcinolone TID. Decadron IM given here in clinic today. Follow-up with allergist vs dermatologist for more definitive testing to  determine etiology of skin eruption. Final Clinical Impressions(s) / UC Diagnoses   Final diagnoses:  Localized skin eruption     Discharge Instructions      Resume Xyzal. Apply triamcinolone twice daily to affected area as needed. Follow-up allergist for more definitive testing.   ED Prescriptions     Medication Sig Dispense Auth. Provider   triamcinolone (KENALOG) 0.025 % ointment Apply 1 application topically 2 (two) times daily. 454 g Bing Neighbors, FNP      PDMP not reviewed this encounter.   Bing Neighbors, FNP 06/27/21 2200

## 2022-09-10 ENCOUNTER — Ambulatory Visit
Admission: RE | Admit: 2022-09-10 | Discharge: 2022-09-10 | Disposition: A | Discharge status: Home / Self Care | Payer: Self-pay | Source: Ambulatory Visit | Attending: Family Medicine | Admitting: Family Medicine

## 2022-09-10 VITALS — BP 130/77 | HR 69 | Temp 98.8°F | Resp 18

## 2022-09-10 DIAGNOSIS — J0100 Acute maxillary sinusitis, unspecified: Secondary | ICD-10-CM

## 2022-09-10 MED ORDER — PROMETHAZINE-DM 6.25-15 MG/5ML PO SYRP: 5.0000 mL | ORAL_SOLUTION | Freq: Four times a day (QID) | ORAL | 0 refills | Status: DC | PRN

## 2022-09-10 MED ORDER — AMOXICILLIN-POT CLAVULANATE 875-125 MG PO TABS: 1.0000 | ORAL_TABLET | Freq: Two times a day (BID) | ORAL | 0 refills | Status: DC

## 2022-09-10 NOTE — ED Provider Notes (Signed)
[facility]    CSN: [id number] Arrival date & time: 09/10/22  0827      History   Chief Complaint Chief Complaint  Patient presents with   Cough    Entered by patient    HPI Robin Cannon is a 26 y.o. female.   Patient presenting today with 2-week history of nasal congestion, productive cough, hoarseness, sore throat and now the past few days facial pain and pressure, sinus headache.  States symptoms have significantly worse in the past few days.  Taking DayQuil, NyQuil, Delsym and consistently taking allergy regimen of antihistamine and nasal sprays.  Works in a daycare so multiple sick contacts.  Denies chest pain, shortness of breath, abdominal pain, nausea vomiting diarrhea, fever, chills, body aches.    Past Medical History:  Diagnosis Date   Kidney stone    Kidney stones    Nausea    Social anxiety disorder 01/09/2020    Patient Active Problem List   Diagnosis Date Noted   Social anxiety disorder 01/09/2020    History reviewed. No pertinent surgical history.  OB History   No obstetric history on file.      Home Medications    Prior to Admission medications   Medication Sig Start Date End Date Taking? Authorizing Provider  amoxicillin-clavulanate (AUGMENTIN) 875-125 MG tablet Take 1 tablet by mouth every 12 (twelve) hours. 09/10/22  Yes Particia Nearing, PA-C  promethazine-dextromethorphan (PROMETHAZINE-DM) 6.25-15 MG/5ML syrup Take 5 mLs by mouth 4 (four) times daily as needed. 09/10/22  Yes Particia Nearing, PA-C  cetirizine (ZYRTEC) 10 MG tablet Take 10 mg by mouth daily as needed for allergies.    [provider]  naproxen (NAPROSYN) 375 MG tablet Take 1 tablet (375 mg total) by mouth 2 (two) times daily. 05/17/21   Bing Neighbors, FNP  polyethylene glycol [facility] / Ethelene Hal) packet Take 17 g by mouth daily as needed (Constipation).    [provider]  sertraline (ZOLOFT) 25 MG tablet Take 1 tablet (25 mg  total) by mouth daily for 15 days. 05/19/20 06/03/20  Elmore Guise, FNP  tiZANidine (ZANAFLEX) 4 MG tablet Take 0.5-1 tablets (2-4 mg total) by mouth every 6 (six) hours as needed for muscle spasms. 05/17/21   Bing Neighbors, FNP  triamcinolone (KENALOG) 0.025 % ointment Apply 1 application topically 2 (two) times daily. 06/21/21   Bing Neighbors, FNP    Family History Family History  Problem Relation Age of Onset   Diabetes Father     Social History Social History   Tobacco Use   Smoking status: Never   Smokeless tobacco: Never  Vaping Use   Vaping Use: Never used  Substance Use Topics   Alcohol use: Never   Drug use: Never     Allergies   Patient has no known allergies.   Review of Systems Review of Systems HPI  Physical Exam Triage Vital Signs ED Triage Vitals  Enc Vitals Group     BP 09/10/22 0842 130/77     Pulse Rate 09/10/22 0842 69     Resp 09/10/22 0842 18     Temp 09/10/22 0842 98.8 F (37.1 C)     Temp Source 09/10/22 0842 Oral     SpO2 09/10/22 0842 99 %     Weight --      Height --      Head Circumference --      Peak Flow --      Pain Score  09/10/22 0845 0     Pain Loc --      Pain Edu? --      Excl. in [place]? --    No data found.  Updated Vital Signs BP 130/77 (BP Location: Right Arm)   Pulse 69   Temp 98.8 F (37.1 C) (Oral)   Resp 18   LMP  (Within Weeks)   SpO2 99%   Visual Acuity Right Eye Distance:   Left Eye Distance:   Bilateral Distance:    Right Eye Near:   Left Eye Near:    Bilateral Near:     Physical Exam Vitals and nursing note reviewed.  Constitutional:      Appearance: Normal appearance.  HENT:     Head: Atraumatic.     Right Ear: Tympanic membrane and external ear normal.     Left Ear: Tympanic membrane and external ear normal.     Nose: Congestion present.     Mouth/Throat:     Mouth: Mucous membranes are moist.     Pharynx: Posterior oropharyngeal erythema present.  Eyes:     Extraocular  Movements: Extraocular movements intact.     Conjunctiva/sclera: Conjunctivae normal.  Cardiovascular:     Rate and Rhythm: Normal rate and regular rhythm.     Heart sounds: Normal heart sounds.  Pulmonary:     Effort: Pulmonary effort is normal.     Breath sounds: Normal breath sounds. No wheezing or rales.  Musculoskeletal:        General: Normal range of motion.     Cervical back: Normal range of motion and neck supple.  Skin:    General: Skin is warm and dry.  Neurological:     Mental Status: She is alert and oriented to person, place, and time.  Psychiatric:        Mood and Affect: Mood normal.        Thought Content: Thought content normal.      UC Treatments / Results  Labs (all labs ordered are listed, but only abnormal results are displayed) Labs Reviewed - No data to display  EKG   Radiology No results found.  Procedures Procedures (including critical care time)  Medications Ordered in UC Medications - No data to display  Initial Impression / Assessment and Plan / UC Course  I have reviewed the triage vital signs and the nursing notes.  Pertinent labs & imaging results that were available during my care of the patient were reviewed by me and considered in my medical decision making (see chart for details).     Given duration and worsening course, will cover with Augmentin, Phenergan DM, continued allergy regimen and over-the-counter cold and congestion medications.  Work note given.  Return for worsening symptoms.  Final Clinical Impressions(s) / UC Diagnoses   Final diagnoses:  Acute non-recurrent maxillary sinusitis   Discharge Instructions   None    ED Prescriptions     Medication Sig Dispense Auth. Provider   amoxicillin-clavulanate (AUGMENTIN) 875-125 MG tablet Take 1 tablet by mouth every 12 (twelve) hours. 14 tablet Volney American, [place]   promethazine-dextromethorphan (PROMETHAZINE-DM) 6.25-15 MG/5ML syrup Take 5 mLs by mouth 4  (four) times daily as needed. 100 mL Volney American, [place]      PDMP not reviewed this encounter.   Merrie Roof [place], [place] 09/10/22 [phone]

## 2022-09-10 NOTE — ED Triage Notes (Signed)
Pt reports for 2 weeks she has had nasal congestion, hoarse voice, cough. Took dayquil cold and flu severe and delsyum which helped a bit.

## 2022-10-10 ENCOUNTER — Ambulatory Visit
Admission: RE | Admit: 2022-10-10 | Discharge: 2022-10-10 | Disposition: A | Discharge status: Home / Self Care | Payer: Self-pay | Source: Ambulatory Visit

## 2022-10-10 ENCOUNTER — Ambulatory Visit: Payer: Self-pay

## 2022-10-10 VITALS — BP 123/84 | HR 108 | Temp 100.2°F | Resp 16

## 2022-10-10 DIAGNOSIS — J020 Streptococcal pharyngitis: Secondary | ICD-10-CM

## 2022-10-10 LAB — POCT RAPID STREP A (OFFICE): Rapid Strep A Screen: POSITIVE — AB

## 2022-10-10 MED ORDER — LIDOCAINE VISCOUS HCL 2 % MT SOLN: 5.0000 mL | OROMUCOSAL | 0 refills | Status: DC | PRN

## 2022-10-10 MED ORDER — PENICILLIN V POTASSIUM 500 MG PO TABS: 500.0000 mg | ORAL_TABLET | Freq: Two times a day (BID) | ORAL | 0 refills | Status: AC

## 2022-10-10 NOTE — Discharge Instructions (Addendum)
The rapid strep test is positive. Take medication as prescribed. Increase fluids and allow for plenty of rest. Recommend over-the-counter children's Tylenol or ibuprofen as needed for pain, fever, or general discomfort. Warm salt water gargles 3-4 times daily to help with throat pain or discomfort. Recommend a diet with soft foods to include soups, broths, puddings, yogurt, Jell-O's, or popsicles until symptoms improve. Change toothbrush after 3 days. Follow-up in this clinic or with your primary care physician if symptoms fail to improve.

## 2022-10-10 NOTE — ED Triage Notes (Signed)
Pt reports body aches, bilateral ear pain, burning sensation in ears, sore throat  1 day.

## 2022-10-10 NOTE — ED Provider Notes (Signed)
[facility]    CSN: [id number] Arrival date & time: 10/10/22  1143      History   Chief Complaint Chief Complaint  Patient presents with   Sore Throat    Ear's hurt, runny/ stopped up nose, headache - Entered by patient   Appointment    1200   Generalized Body Aches          HPI Robin Cannon is a 26 y.o. female.   The history is provided by the patient.   Patient presents with a 1 day history of sore throat, generalized bodyaches, headache, and bilateral ear pain.  Patient describes the pain in her ears as "burning". Patient denies fever, chills, runny nose, abdominal pain, nausea, vomiting, or diarrhea.  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 Patient states she has not taken any medication for her symptoms.  Past Medical History:  Diagnosis Date   Kidney stone    Kidney stones    Nausea    Social anxiety disorder 01/09/2020    Patient Active Problem List   Diagnosis Date Noted   Social anxiety disorder 01/09/2020    History reviewed. No pertinent surgical history.  OB History   No obstetric history on file.      Home Medications    Prior to Admission medications   Medication Sig Start Date End Date Taking? Authorizing Provider  lidocaine (XYLOCAINE) 2 % solution Use as directed 5 mLs in the mouth or throat as needed for mouth pain. 10/10/22  Yes Courtney Bellizzi-Warren, Sadie Haber, NP  penicillin v potassium (VEETID) 500 MG tablet Take 1 tablet (500 mg total) by mouth 2 (two) times daily for 10 days. 10/10/22 10/20/22 Yes Bryar Rennie-Warren, Sadie Haber, NP  TRI-SPRINTEC 0.18/0.215/0.25 MG-35 MCG tablet Take 1 tablet by mouth daily. 05/06/22  Yes [provider]  amoxicillin-clavulanate (AUGMENTIN) 875-125 MG tablet Take 1 tablet by mouth every 12 (twelve) hours. 09/10/22   Particia Nearing, PA-C  cetirizine (ZYRTEC) 10 MG tablet Take 10 mg by mouth daily as needed for allergies.    [provider]  naproxen (NAPROSYN) 375 MG  tablet Take 1 tablet (375 mg total) by mouth 2 (two) times daily. 05/17/21   Bing Neighbors, FNP  polyethylene glycol [facility] / Ethelene Hal) packet Take 17 g by mouth daily as needed (Constipation).    [provider]  promethazine-dextromethorphan (PROMETHAZINE-DM) 6.25-15 MG/5ML syrup Take 5 mLs by mouth 4 (four) times daily as needed. 09/10/22   Particia Nearing, PA-C  sertraline (ZOLOFT) 25 MG tablet Take 1 tablet (25 mg total) by mouth daily for 15 days. 05/19/20 06/03/20  Elmore Guise, FNP  tiZANidine (ZANAFLEX) 4 MG tablet Take 0.5-1 tablets (2-4 mg total) by mouth every 6 (six) hours as needed for muscle spasms. 05/17/21   Bing Neighbors, FNP  triamcinolone (KENALOG) 0.025 % ointment Apply 1 application topically 2 (two) times daily. 06/21/21   Bing Neighbors, FNP    Family History Family History  Problem Relation Age of Onset   Diabetes Father     Social History Social History   Tobacco Use   Smoking status: Never   Smokeless tobacco: Never  Vaping Use   Vaping Use: Never used  Substance Use Topics   Alcohol use: Never   Drug use: Never     Allergies   Patient has no known allergies.   Review of Systems Review of Systems Per HPI  Physical Exam Triage Vital Signs ED Triage Vitals  Enc Vitals Group     BP 10/10/22 1209 123/84     Pulse Rate 10/10/22 1209 (!) 108     Resp 10/10/22 1209 16     Temp 10/10/22 1209 100.2 F (37.9 C)     Temp Source 10/10/22 1209 Oral     SpO2 10/10/22 1209 99 %     Weight --      Height --      Head Circumference --      Peak Flow --      Pain Score 10/10/22 1208 8     Pain Loc --      Pain Edu? --      Excl. in [place]? --    No data found.  Updated Vital Signs BP 123/84 (BP Location: Right Arm)   Pulse (!) 108   Temp 100.2 F (37.9 C) (Oral)   Resp 16   LMP  (Within Weeks)   SpO2 99%   Visual Acuity Right Eye Distance:   Left Eye Distance:   Bilateral Distance:    Right Eye Near:   Left  Eye Near:    Bilateral Near:     Physical Exam Vitals and nursing note reviewed.  Constitutional:      General: She is not in acute distress.    Appearance: She is well-developed.  HENT:     Head: Normocephalic.     Right Ear: Tympanic membrane and ear canal normal.     Left Ear: Tympanic membrane and ear canal normal.     Nose: No congestion.     Mouth/Throat:     Mouth: Mucous membranes are moist.     Pharynx: Uvula midline. Pharyngeal swelling, oropharyngeal exudate, posterior oropharyngeal erythema and uvula swelling present.     Tonsils: 2+ on the right. 2+ on the left.  Eyes:     Conjunctiva/sclera: Conjunctivae normal.     Pupils: Pupils are equal, round, and reactive to light.  Cardiovascular:     Rate and Rhythm: Regular rhythm. Tachycardia present.     Heart sounds: Normal heart sounds.  Pulmonary:     Effort: Pulmonary effort is normal.     Breath sounds: Normal breath sounds.  Abdominal:     General: Bowel sounds are normal.     Palpations: Abdomen is soft.     Tenderness: There is no abdominal tenderness.  Musculoskeletal:     Cervical back: Normal range of motion.  Lymphadenopathy:     Cervical: Cervical adenopathy present.  Skin:    General: Skin is warm and dry.  Neurological:     General: No focal deficit present.     Mental Status: She is alert and oriented to person, place, and time.  Psychiatric:        Mood and Affect: Mood normal.        Behavior: Behavior normal.      UC Treatments / Results  Labs (all labs ordered are listed, but only abnormal results are displayed) Labs Reviewed  POCT RAPID STREP A (OFFICE) - Abnormal; Notable for the following components:      Result Value   Rapid Strep A Screen Positive (*)    All other components within normal limits    EKG   Radiology No results found.  Procedures Procedures (including critical care time)  Medications Ordered in UC Medications - No data to display  Initial Impression /  Assessment and Plan / UC Course  I have reviewed the triage vital signs and the  nursing notes.  Pertinent labs & imaging results that were available during my care of the patient were reviewed by me and considered in my medical decision making (see chart for details).  The patient is well-appearing, she is in no acute distress, she is well appearing, and is no acute distress, she is mildly tachycardic,. Vital signs are otherwise stable. Rapid strep test is positive. Will treat with Penicillin VK 500mg .  Supportive care recommendations were provided to the patient to include ibuprofen or Tylenol for pain or fever, warm salt water gargles 3-4 times daily, and discarding her toothbrush after 3 days.  Patient verbalizes understanding.  All questions were answered.  Patient is stable for discharge.  Work note was provided. Final Clinical Impressions(s) / UC Diagnoses   Final diagnoses:  Streptococcal sore throat     Discharge Instructions      The rapid strep test is positive. Take medication as prescribed. Increase fluids and allow for plenty of rest. Recommend over-the-counter children's Tylenol or ibuprofen as needed for pain, fever, or general discomfort. Warm salt water gargles 3-4 times daily to help with throat pain or discomfort. Recommend a diet with soft foods to include soups, broths, puddings, yogurt, Jell-O's, or popsicles until symptoms improve. Change toothbrush after 3 days. Follow-up in this clinic or with your primary care physician if symptoms fail to improve.    ED Prescriptions     Medication Sig Dispense Auth. Provider   lidocaine (XYLOCAINE) 2 % solution Use as directed 5 mLs in the mouth or throat as needed for mouth pain. 100 mL Brick Ketcher-Warren, , NP   penicillin v potassium (VEETID) 500 MG tablet Take 1 tablet (500 mg total) by mouth 2 (two) times daily for 10 days. 20 tablet Amye Grego-Warren, Sadie Haber, NP      PDMP not reviewed this encounter.    Sadie Haber, NP 10/10/22 1240

## 2022-12-24 ENCOUNTER — Ambulatory Visit: Payer: Self-pay

## 2022-12-27 ENCOUNTER — Ambulatory Visit
Admission: RE | Admit: 2022-12-27 | Discharge: 2022-12-27 | Disposition: A | Discharge status: Home / Self Care | Payer: 59 | Source: Ambulatory Visit | Attending: Nurse Practitioner | Admitting: Nurse Practitioner

## 2022-12-27 ENCOUNTER — Other Ambulatory Visit: Payer: Self-pay

## 2022-12-27 VITALS — BP 120/74 | HR 60 | Temp 98.6°F | Resp 20

## 2022-12-27 DIAGNOSIS — J069 Acute upper respiratory infection, unspecified: Secondary | ICD-10-CM

## 2022-12-27 DIAGNOSIS — Z1152 Encounter for screening for COVID-19: Secondary | ICD-10-CM | POA: Insufficient documentation

## 2022-12-27 MED ORDER — BENZONATATE 100 MG PO CAPS: 100.0000 mg | ORAL_CAPSULE | Freq: Three times a day (TID) | ORAL | 0 refills | Status: DC | PRN

## 2022-12-27 NOTE — ED Triage Notes (Signed)
Pt reports sore throat, cough, nasal drainage since Friday. Pt reports was taking dayquil and reports throat pain improved but returned worse yesterday. Denies any known fevers.

## 2022-12-27 NOTE — Discharge Instructions (Addendum)
You have a viral upper respiratory infection.  Symptoms should improve over the next week to 10 days.  If you develop chest pain or shortness of breath, go to the emergency room.  We have tested you today for COVID-19.  You will see the results in Mychart and we will call you with positive results.  Please stay home and isolate until you are aware of the results.    Some things that can make you feel better are: - Increased rest - Increasing fluid with water/sugar free electrolytes - Acetaminophen and ibuprofen as needed for fever/pain - Salt water gargling, chloraseptic spray and throat lozenges for sore throat - OTC guaifenesin (Mucinex) 600 mg twice daily for congestion - Saline sinus flushes or a neti pot for congestion - Humidifying the air -Tessalon Perles during the day as needed for dry cough if this develops

## 2022-12-27 NOTE — ED Provider Notes (Signed)
[facility]    CSN: [id number] Arrival date & time: 12/27/22  1051      History   Chief Complaint Chief Complaint  Patient presents with   Sore Throat    Entered by patient    HPI Robin Cannon is a 27 y.o. female.   Patient presents today with a 4-day history of nasal congestion, runny nose, and sore throat.  Also endorses fatigue.  She denies fever, body aches, chills, cough or shortness of breath, chest pain or tightness, chest congestion, headache, ear pain, abdominal pain, nausea/vomiting, diarrhea, decreased appetite, and loss of taste or smell.  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  Initially was taking DayQuil which helped with symptoms, but stopped it.  After she stopped it, the symptoms returned.    Past Medical History:  Diagnosis Date   Kidney stone    Kidney stones    Nausea    Social anxiety disorder 01/09/2020    Patient Active Problem List   Diagnosis Date Noted   Social anxiety disorder 01/09/2020    History reviewed. No pertinent surgical history.  OB History   No obstetric history on file.      Home Medications    Prior to Admission medications   Medication Sig Start Date End Date Taking? Authorizing Provider  benzonatate (TESSALON) 100 MG capsule Take 1 capsule (100 mg total) by mouth 3 (three) times daily as needed for cough. Do not take with alcohol or while driving or operating heavy machinery.  May cause drowsiness. 12/27/22  Yes Eulogio Bear, NP  cetirizine (ZYRTEC) 10 MG tablet Take 10 mg by mouth daily as needed for allergies.    [provider]  polyethylene glycol (MIRALAX / GLYCOLAX) packet Take 17 g by mouth daily as needed (Constipation).    [provider]  sertraline (ZOLOFT) 25 MG tablet Take 1 tablet (25 mg total) by mouth daily for 15 days. 05/19/20 06/03/20  Annie Main, FNP  TRI-SPRINTEC 0.18/0.215/0.25 MG-35 MCG tablet Take 1 tablet by mouth daily.  05/06/22   [provider]    Family History Family History  Problem Relation Age of Onset   Diabetes Father     Social History Social History   Tobacco Use   Smoking status: Never   Smokeless tobacco: Never  Vaping Use   Vaping Use: Never used  Substance Use Topics   Alcohol use: Never   Drug use: Never     Allergies   Patient has no known allergies.   Review of Systems Review of Systems Per HPI  Physical Exam Triage Vital Signs ED Triage Vitals  Enc Vitals Group     BP 12/27/22 1120 120/74     Pulse Rate 12/27/22 1120 60     Resp 12/27/22 1120 20     Temp 12/27/22 1120 98.6 F (37 C)     Temp Source 12/27/22 1120 Oral     SpO2 12/27/22 1120 99 %     Weight --      Height --      Head Circumference --      Peak Flow --      Pain Score 12/27/22 1118 5     Pain Loc --      Pain Edu? --      Excl. in [place]? --    No data found.  Updated Vital Signs BP 120/74 (BP Location: Right Arm)   Pulse 60   Temp  98.6 F (37 C) (Oral)   Resp 20   LMP 12/03/2022 (Approximate)   SpO2 99%   Visual Acuity Right Eye Distance:   Left Eye Distance:   Bilateral Distance:    Right Eye Near:   Left Eye Near:    Bilateral Near:     Physical Exam Vitals and nursing note reviewed.  Constitutional:      General: She is not in acute distress.    Appearance: Normal appearance. She is not ill-appearing or toxic-appearing.  HENT:     Head: Normocephalic and atraumatic.     Right Ear: Tympanic membrane, ear canal and external ear normal. No drainage, swelling or tenderness. No middle ear effusion. Tympanic membrane is not erythematous.     Left Ear: Tympanic membrane, ear canal and external ear normal. No drainage, swelling or tenderness.  No middle ear effusion. Tympanic membrane is not erythematous.     Nose: No congestion or rhinorrhea.     Mouth/Throat:     Mouth: Mucous membranes are moist.     Pharynx: Oropharynx is clear. Posterior oropharyngeal erythema  present. No oropharyngeal exudate.  Eyes:     General: No scleral icterus.    Extraocular Movements: Extraocular movements intact.  Cardiovascular:     Rate and Rhythm: Normal rate and regular rhythm.  Pulmonary:     Effort: Pulmonary effort is normal. No respiratory distress.     Breath sounds: Normal breath sounds. No wheezing, rhonchi or rales.  Abdominal:     General: Abdomen is flat. Bowel sounds are normal. There is no distension.     Palpations: Abdomen is soft.  Musculoskeletal:     Cervical back: Normal range of motion and neck supple.  Lymphadenopathy:     Cervical: No cervical adenopathy.  Skin:    General: Skin is warm and dry.     Coloration: Skin is not jaundiced or pale.     Findings: No erythema or rash.  Neurological:     Mental Status: She is alert and oriented to person, place, and time.     Motor: No weakness.  Psychiatric:        Behavior: Behavior is cooperative.      UC Treatments / Results  Labs (all labs ordered are listed, but only abnormal results are displayed) Labs Reviewed  SARS CORONAVIRUS 2 (TAT 6-24 HRS)    EKG   Radiology No results found.  Procedures Procedures (including critical care time)  Medications Ordered in UC Medications - No data to display  Initial Impression / Assessment and Plan / UC Course  I have reviewed the triage vital signs and the nursing notes.  Pertinent labs & imaging results that were available during my care of the patient were reviewed by me and considered in my medical decision making (see chart for details).   Patient is well-appearing, normotensive, afebrile, not tachycardic, not tachypneic, oxygenating well on room air.    1. Encounter for screening for COVID-19 2. Viral URI Suspect viral etiology Examination and vital signs today are reassuring COVID-19 test is pending Supportive care discussed with patient Start Tessalon Perles every dry cough develops ER and return precautions discussed  with patient Note given for work  The patient was given the opportunity to ask questions.  All questions answered to their satisfaction.  The patient is in agreement to this plan.    Final Clinical Impressions(s) / UC Diagnoses   Final diagnoses:  Encounter for screening for COVID-19  Viral URI  Discharge Instructions      You have a viral upper respiratory infection.  Symptoms should improve over the next week to 10 days.  If you develop chest pain or shortness of breath, go to the emergency room.  We have tested you today for COVID-19.  You will see the results in Mychart and we will call you with positive results.  Please stay home and isolate until you are aware of the results.    Some things that can make you feel better are: - Increased rest - Increasing fluid with water/sugar free electrolytes - Acetaminophen and ibuprofen as needed for fever/pain - Salt water gargling, chloraseptic spray and throat lozenges for sore throat - OTC guaifenesin (Mucinex) 600 mg twice daily for congestion - Saline sinus flushes or a neti pot for congestion - Humidifying the air -Tessalon Perles during the day as needed for dry cough if this develops     ED Prescriptions     Medication Sig Dispense Auth. Provider   benzonatate (TESSALON) 100 MG capsule Take 1 capsule (100 mg total) by mouth 3 (three) times daily as needed for cough. Do not take with alcohol or while driving or operating heavy machinery.  May cause drowsiness. 21 capsule Eulogio Bear, NP      PDMP not reviewed this encounter.   Eulogio Bear, NP 12/27/22 1147

## 2022-12-28 LAB — SARS CORONAVIRUS 2 (TAT 6-24 HRS): SARS Coronavirus 2: NEGATIVE

## 2023-01-03 ENCOUNTER — Ambulatory Visit (INDEPENDENT_AMBULATORY_CARE_PROVIDER_SITE_OTHER): Payer: 59

## 2023-01-03 ENCOUNTER — Ambulatory Visit
Admission: RE | Admit: 2023-01-03 | Discharge: 2023-01-03 | Disposition: A | Discharge status: Home / Self Care | Payer: 59 | Source: Ambulatory Visit | Attending: Nurse Practitioner | Admitting: Nurse Practitioner

## 2023-01-03 VITALS — BP 106/66 | HR 48 | Temp 98.2°F | Resp 14

## 2023-01-03 DIAGNOSIS — M25572 Pain in left ankle and joints of left foot: Secondary | ICD-10-CM | POA: Diagnosis not present

## 2023-01-03 DIAGNOSIS — S93402A Sprain of unspecified ligament of left ankle, initial encounter: Secondary | ICD-10-CM | POA: Diagnosis not present

## 2023-01-03 NOTE — Discharge Instructions (Addendum)
The x-ray of the left ankle is negative for fracture or dislocation.  Based on the mechanism of injury, symptoms are consistent with a left ankle sprain. Continue ibuprofen as needed for pain or discomfort.  Recommend 4 tablets (800 mg) every 8 hours as needed.  Take medication with food and water. RICE therapy is recommended.  Rest, ice, compression, and elevation.  When applying ice, apply for 20 minutes, remove for 1 hour, then repeat is much as possible. Wear the ankle brace with prolonged or strenuous activity to provide compression and support. Gentle stretching and range of motion when sitting and at rest. As discussed, symptoms should improve after 1 to 2 weeks.  If symptoms do not improve by that time, or appear to worsen, please follow-up with orthopedics for further evaluation.  You can follow-up with [facility] at [phone] or with [facility] at [phone].

## 2023-01-03 NOTE — ED Triage Notes (Signed)
Pt reports she twisted the left ankle playing kickball yesterday. Ibuprofen gives some relief.

## 2023-01-03 NOTE — ED Provider Notes (Signed)
[facility]    CSN: [id number] Arrival date & time: 01/03/23  0845      History   Chief Complaint Chief Complaint  Patient presents with   Ankle Pain    Entered by patient   Appointment    0900    HPI Robin Cannon is a 27 y.o. female.   The history is provided by the patient.   The patient presents for complaints of left ankle pain.  States that she was playing kickball at school while at work yesterday, and fell and twisted her left ankle.  Patient reports pain to the "outside of the left ankle" and bruising.  She states that she is able to walk on the ankle, and can move it.  She states that she does not have any numbness, tingling, decrease sensation, or radiation of pain.  Patient reports that she has been taking ibuprofen for her symptoms.  Denies any previous history of left ankle injury.  Past Medical History:  Diagnosis Date   Kidney stone    Kidney stones    Nausea    Social anxiety disorder 01/09/2020    Patient Active Problem List   Diagnosis Date Noted   Social anxiety disorder 01/09/2020    History reviewed. No pertinent surgical history.  OB History   No obstetric history on file.      Home Medications    Prior to Admission medications   Medication Sig Start Date End Date Taking? Authorizing Provider  benzonatate (TESSALON) 100 MG capsule Take 1 capsule (100 mg total) by mouth 3 (three) times daily as needed for cough. Do not take with alcohol or while driving or operating heavy machinery.  May cause drowsiness. 12/27/22   Eulogio Bear, NP  cetirizine (ZYRTEC) 10 MG tablet Take 10 mg by mouth daily as needed for allergies.    [provider]  polyethylene glycol (MIRALAX / GLYCOLAX) packet Take 17 g by mouth daily as needed (Constipation).    [provider]  sertraline (ZOLOFT) 25 MG tablet Take 1 tablet (25 mg total) by mouth daily for 15 days. 05/19/20 06/03/20  Annie Main, FNP  TRI-SPRINTEC  0.18/0.215/0.25 MG-35 MCG tablet Take 1 tablet by mouth daily. 05/06/22   [provider]    Family History Family History  Problem Relation Age of Onset   Diabetes Father     Social History Social History   Tobacco Use   Smoking status: Never   Smokeless tobacco: Never  Vaping Use   Vaping Use: Never used  Substance Use Topics   Alcohol use: Never   Drug use: Never     Allergies   Patient has no known allergies.   Review of Systems Review of Systems Per HPI  Physical Exam Triage Vital Signs ED Triage Vitals  Enc Vitals Group     BP 01/03/23 0906 106/66     Pulse Rate 01/03/23 0906 (!) 48     Resp 01/03/23 0906 14     Temp 01/03/23 0906 98.2 F (36.8 C)     Temp Source 01/03/23 0906 Oral     SpO2 01/03/23 0906 98 %     Weight --      Height --      Head Circumference --      Peak Flow --      Pain Score 01/03/23 0905 6     Pain Loc --      Pain Edu? --  Excl. in GC? --    No data found.  Updated Vital Signs BP 106/66 (BP Location: Right Arm)   Pulse (!) 48   Temp 98.2 F (36.8 C) (Oral)   Resp 14   LMP 12/03/2022 (Approximate)   SpO2 98%   Visual Acuity Right Eye Distance:   Left Eye Distance:   Bilateral Distance:    Right Eye Near:   Left Eye Near:    Bilateral Near:     Physical Exam Vitals and nursing note reviewed.  Constitutional:      General: She is not in acute distress.    Appearance: Normal appearance.  Eyes:     Extraocular Movements: Extraocular movements intact.     Pupils: Pupils are equal, round, and reactive to light.  Pulmonary:     Effort: Pulmonary effort is normal.  Musculoskeletal:     Cervical back: Normal range of motion.     Left ankle: Ecchymosis present. No swelling or deformity. Tenderness present over the lateral malleolus. Normal range of motion. Normal pulse.  Skin:    General: Skin is warm and dry.  Neurological:     General: No focal deficit present.     Mental Status: She is alert and  oriented to person, place, and time.  Psychiatric:        Mood and Affect: Mood normal.        Behavior: Behavior normal.      UC Treatments / Results  Labs (all labs ordered are listed, but only abnormal results are displayed) Labs Reviewed - No data to display  EKG   Radiology No results found.  Procedures Procedures (including critical care time)  Medications Ordered in UC Medications - No data to display  Initial Impression / Assessment and Plan / UC Course  I have reviewed the triage vital signs and the nursing notes.  Pertinent labs & imaging results that were available during my care of the patient were reviewed by me and considered in my medical decision making (see chart for details).  The patient is well-appearing, she is in no acute distress, vital signs are stable.  X-ray of the ankle is negative for fracture dislocation.  Based on the mechanism of injury, symptoms are consistent with a left ankle sprain.  Ankle brace was provided to provide compression and support.  RICE therapy was recommended.  Supportive care recommendations were provided to the patient along with indications when follow-up may be necessary.  Patient is in agreement with this plan of care and verbalizes understanding.  All questions were answered.  Patient stable for discharge.  Note was provided for work.  Final Clinical Impressions(s) / UC Diagnoses   Final diagnoses:  Sprain of left ankle, unspecified ligament, initial encounter     Discharge Instructions      The x-ray of the left ankle is negative for fracture or dislocation.  Based on the mechanism of injury, symptoms are consistent with a left ankle sprain. Continue ibuprofen as needed for pain or discomfort.  Recommend 4 tablets (800 mg) every 8 hours as needed.  Take medication with food and water. RICE therapy is recommended.  Rest, ice, compression, and elevation.  When applying ice, apply for 20 minutes, remove for 1 hour,  then repeat is much as possible. Wear the ankle brace with prolonged or strenuous activity to provide compression and support. Gentle stretching and range of motion when sitting and at rest. As discussed, symptoms should improve after 1 to 2 weeks.  If symptoms do not improve by that time, or appear to worsen, please follow-up with orthopedics for further evaluation.  You can follow-up with [facility] at [phone] or with [facility] at [phone].      ED Prescriptions   None    PDMP not reviewed this encounter.   Tish Men, NP 01/03/23 1020

## 2023-08-29 ENCOUNTER — Ambulatory Visit: Payer: 59 | Admitting: Internal Medicine

## 2023-09-13 NOTE — Progress Notes (Signed)
Updated medical history.

## 2023-09-20 ENCOUNTER — Encounter: Payer: Self-pay | Admitting: Internal Medicine

## 2023-09-20 ENCOUNTER — Ambulatory Visit: Payer: 59 | Attending: Internal Medicine | Admitting: Internal Medicine

## 2023-09-20 VITALS — BP 118/86 | HR 53 | Ht 66.0 in | Wt 222.6 lb

## 2023-09-20 DIAGNOSIS — R001 Bradycardia, unspecified: Secondary | ICD-10-CM | POA: Diagnosis not present

## 2023-09-20 DIAGNOSIS — R55 Syncope and collapse: Secondary | ICD-10-CM | POA: Insufficient documentation

## 2023-09-20 DIAGNOSIS — E039 Hypothyroidism, unspecified: Secondary | ICD-10-CM | POA: Insufficient documentation

## 2023-09-20 NOTE — Progress Notes (Addendum)
Cardiology Office Note  Date: 09/20/2023   ID: Robin Cannon, DOB 11-24-1995, MRN [id number]  PCP:  [facility]  Cardiologist:  None Electrophysiologist:  None   History of Present Illness: Robin Cannon is a 27 y.o. female known to have social anxiety was referred to cardiology clinic for evaluation of syncope.  Patient passed out 3 times so far in her life, first episode was when she was admitted in the [facility] and nurse was trying to get IV/blood draw, nurse was not able to get the IV, instructed her to hold it as she wanted to go to the bathroom, she got up, walked and hit the wall hard.  Second time, she had a scab on her hand which she removed and saw her hand bleeding resulting in syncope.  Third episode was when she was having heavy menstrual flow and saw blood everywhere in her bathroom when she was taking a shower.  She does have episodes of syncope especially when she sees blood.  I reviewed her EKG that showed sinus bradycardia and I reviewed her previous EKGs that showed HR between 40s and 50s.  She underwent event monitor at [facility] in 07/2023, I do not have any records to review.  She denies having any symptoms of exertional dizziness although she does have fatigue.  The syncopal spells as as stated above.  No angina, DOE, orthopnea, PND.  She also gained significant amount of weight in the last 1 year.  She feels tired all the time.  I reviewed the PCP records, TSH was not obtained.   Past Medical History:  Diagnosis Date   Bradycardia    Kidney stone    Kidney stones    Nausea    Social anxiety disorder 01/09/2020   Syncope and collapse     No past surgical history on file.  Current Outpatient Medications  Medication Sig Dispense Refill   cetirizine (ZYRTEC) 10 MG tablet Take 10 mg by mouth daily as needed for allergies.     ibuprofen (ADVIL) 800 MG tablet Take 800 mg by mouth 3 (three) times daily as needed.     ondansetron  (ZOFRAN-ODT) 4 MG disintegrating tablet Take by mouth.     propranolol ER (INDERAL LA) 80 MG 24 hr capsule Take 80 mg by mouth at bedtime.     rizatriptan (MAXALT) 10 MG tablet PLEASE SEE ATTACHED FOR DETAILED DIRECTIONS     sertraline (ZOLOFT) 100 MG tablet Take 100 mg by mouth daily.     TRI-SPRINTEC 0.18/0.215/0.25 MG-35 MCG tablet Take 1 tablet by mouth daily.     No current facility-administered medications for this visit.   Allergies:  Patient has no known allergies.   Social History: The patient  reports that she has never smoked. She has never used smokeless tobacco. She reports that she does not drink alcohol and does not use drugs.   Family History: The patient's family history includes Diabetes in her father.   ROS:  Please see the history of present illness. Otherwise, complete review of systems is positive for none  All other systems are reviewed and negative.   Physical Exam: VS:  BP 118/86   Pulse (!) 53   Ht 5\' 6"  (1.676 m)   Wt 222 lb 9.6 oz (101 kg)   SpO2 99%   BMI 35.93 kg/m , BMI Body mass index is 35.93 kg/m.  Wt Readings from Last 3 Encounters:  09/20/23 222 lb 9.6 oz (101 kg)  02/06/20 168 lb (76.2 kg)  01/09/20 164 lb 9.6 oz (74.7 kg)    General: Patient appears comfortable at rest. HEENT: Conjunctiva and lids normal, oropharynx clear with moist mucosa. Neck: Supple, no elevated JVP or carotid bruits, no thyromegaly. Lungs: Clear to auscultation, nonlabored breathing at rest. Cardiac: Regular rate and rhythm, no S3 or significant systolic murmur, no pericardial rub. Abdomen: Soft, nontender, no hepatomegaly, bowel sounds present, no guarding or rebound. Extremities: No pitting edema, distal pulses 2+. Skin: Warm and dry. Musculoskeletal: No kyphosis. Neuropsychiatric: Alert and oriented x3, affect grossly appropriate.  Recent Labwork: No results found for requested labs within last 365 days.     Component Value Date/Time   CHOL 172 01/09/2020  0905   TRIG 78 01/09/2020 0905   HDL 69 01/09/2020 0905   CHOLHDL 2.5 01/09/2020 0905   LDLCALC 86 01/09/2020 0905     Assessment and Plan:   Vasovagal syncope Clinical hypothyroidism   -Syncope secondary to exposure to blood draw or seeing a lot of blood.  Vasovagal in nature, reassurance provided. -Patient had weight gain, fatigue, sinus bradycardia, will obtain TSH, T3 and T4 levels.  If noted to have hypothyroidism, she will benefit from initiation of levothyroxine, will defer to PCP at that time.  If thyroid panel is normal, she will benefit from ETT.   I spent a total duration of 30 minutes reviewing the prior notes, EKG, labs, counseling provided for vasovagal syncope and educated about the symptoms and workup of hypothyroidism.      Medication Adjustments/Labs and Tests Ordered: Current medicines are reviewed at length with the patient today.  Concerns regarding medicines are outlined above.    Disposition:  Follow up  pending results  Signed Tashica Provencio Verne Spurr, MD, 09/20/2023 9:55 AM    [facility] at [facility] [address] [place], [place], [place] [address]

## 2023-09-20 NOTE — Patient Instructions (Signed)
Medication Instructions:  Your physician recommends that you continue on your current medications as directed. Please refer to the Current Medication list given to you today.   Labwork: Thyroid Panel to be completed today at [facility]  Testing/Procedures: None  Follow-Up: Your physician recommends that you schedule a follow-up appointment in: Pending Results  Any Other Special Instructions Will Be Listed Below (If Applicable). Thank you for choosing [facility]!     If you need a refill on your cardiac medications before your next appointment, please call your pharmacy.

## 2023-09-28 ENCOUNTER — Encounter: Payer: Self-pay | Admitting: Internal Medicine

## 2023-09-28 ENCOUNTER — Telehealth: Payer: Self-pay

## 2023-09-28 DIAGNOSIS — R001 Bradycardia, unspecified: Secondary | ICD-10-CM

## 2023-09-28 NOTE — Telephone Encounter (Signed)
-----   Message from Vishnu P Mallipeddi sent at 09/22/2023  1:12 PM EST ----- Normal thyroid function test.  Obtain exercise tolerance test (Diagnosis is sinus bradycardia)

## 2023-09-28 NOTE — Telephone Encounter (Signed)
Patient notified and verbalized understanding. Pt had no questions or concerns at this time. PCP copied.  Will upload instructions on her MyChart as well

## 2023-09-29 ENCOUNTER — Telehealth: Payer: Self-pay | Admitting: Internal Medicine

## 2023-09-29 NOTE — Telephone Encounter (Signed)
Checking percert on the following patient for testing scheduled at [facility]   GXT    10/02/23

## 2023-10-02 ENCOUNTER — Ambulatory Visit (HOSPITAL_COMMUNITY)
Admission: RE | Admit: 2023-10-02 | Discharge: 2023-10-02 | Disposition: A | Discharge status: Home / Self Care | Payer: 59 | Source: Ambulatory Visit | Attending: Internal Medicine | Admitting: Internal Medicine

## 2023-10-02 DIAGNOSIS — R001 Bradycardia, unspecified: Secondary | ICD-10-CM | POA: Insufficient documentation

## 2023-10-02 LAB — EXERCISE TOLERANCE TEST
Angina Index: 0
Duke Treadmill Score: 6
Estimated workload: 7.4
Exercise duration (min): 6 min
Exercise duration (sec): 9 s
MPHR: 195 {beats}/min
Peak HR: 179 {beats}/min
Percent HR: 91 %
RPE: 17
Rest HR: 65 {beats}/min
ST Depression (mm): 0 mm

## 2023-10-13 ENCOUNTER — Ambulatory Visit: Payer: 59 | Admitting: Internal Medicine

## 2023-10-30 ENCOUNTER — Ambulatory Visit
Admission: RE | Admit: 2023-10-30 | Discharge: 2023-10-30 | Disposition: A | Discharge status: Home / Self Care | Payer: 59 | Source: Ambulatory Visit | Attending: Family Medicine | Admitting: Family Medicine

## 2023-10-30 VITALS — BP 108/72 | HR 76 | Temp 98.5°F | Resp 20

## 2023-10-30 DIAGNOSIS — J069 Acute upper respiratory infection, unspecified: Secondary | ICD-10-CM | POA: Diagnosis not present

## 2023-10-30 LAB — POCT RAPID STREP A (OFFICE): Rapid Strep A Screen: NEGATIVE

## 2023-10-30 LAB — POC COVID19/FLU A&B COMBO
Covid Antigen, POC: NEGATIVE
Influenza A Antigen, POC: NEGATIVE
Influenza B Antigen, POC: NEGATIVE

## 2023-10-30 MED ORDER — PROMETHAZINE-DM 6.25-15 MG/5ML PO SYRP: 5.0000 mL | ORAL_SOLUTION | Freq: Four times a day (QID) | ORAL | 0 refills | Status: AC | PRN

## 2023-10-30 MED ORDER — ONDANSETRON 4 MG PO TBDP: 4.0000 mg | ORAL_TABLET | Freq: Three times a day (TID) | ORAL | 0 refills | Status: AC | PRN

## 2023-10-30 MED ORDER — LIDOCAINE VISCOUS HCL 2 % MT SOLN: 10.0000 mL | OROMUCOSAL | 0 refills | Status: AC | PRN

## 2023-10-30 MED ORDER — FLUTICASONE PROPIONATE 50 MCG/ACT NA SUSP: 1.0000 | Freq: Two times a day (BID) | NASAL | 2 refills | Status: AC

## 2023-10-30 NOTE — ED Provider Notes (Signed)
[facility]    CSN: [id number] Arrival date & time: 10/30/23  0854      History   Chief Complaint Chief Complaint  Patient presents with   Sore Throat    Entered by patient    HPI Robin Cannon is a 27 y.o. female.   Patient presenting today with 3-day history of sore throat, ear pain, fatigue, congestion, cough, nausea.  Denies chest pain, shortness of breath, abdominal pain, vomiting, diarrhea.  Unsure if she has had fevers.  So far trying over-the-counter remedies with no relief.  Multiple sick contacts including neighbors with the flu.    Past Medical History:  Diagnosis Date   Bradycardia    Kidney stone    Kidney stones    Nausea    Social anxiety disorder 01/09/2020   Syncope and collapse     Patient Active Problem List   Diagnosis Date Noted   Vasovagal syncope 09/20/2023   Sinus bradycardia 09/20/2023   Hypothyroidism 09/20/2023   Social anxiety disorder 01/09/2020    History reviewed. No pertinent surgical history.  OB History   No obstetric history on file.      Home Medications    Prior to Admission medications   Medication Sig Start Date End Date Taking? Authorizing Provider  fluticasone (FLONASE) 50 MCG/ACT nasal spray Place 1 spray into both nostrils 2 (two) times daily. 10/30/23  Yes Particia Nearing, PA-C  lidocaine (XYLOCAINE) 2 % solution Use as directed 10 mLs in the mouth or throat every 3 (three) hours as needed. 10/30/23  Yes Particia Nearing, PA-C  ondansetron (ZOFRAN-ODT) 4 MG disintegrating tablet Take 1 tablet (4 mg total) by mouth every 8 (eight) hours as needed for nausea or vomiting. 10/30/23  Yes Particia Nearing, PA-C  promethazine-dextromethorphan (PROMETHAZINE-DM) 6.25-15 MG/5ML syrup Take 5 mLs by mouth 4 (four) times daily as needed. 10/30/23  Yes Particia Nearing, PA-C  cetirizine (ZYRTEC) 10 MG tablet Take 10 mg by mouth daily as needed for allergies.    [provider]   ibuprofen (ADVIL) 800 MG tablet Take 800 mg by mouth 3 (three) times daily as needed. 09/11/23   [provider]  ondansetron (ZOFRAN-ODT) 4 MG disintegrating tablet Take by mouth.    [provider]  propranolol ER (INDERAL LA) 80 MG 24 hr capsule Take 80 mg by mouth at bedtime. 08/30/23   [provider]  rizatriptan (MAXALT) 10 MG tablet PLEASE SEE ATTACHED FOR DETAILED DIRECTIONS 07/11/23   [provider]  sertraline (ZOLOFT) 100 MG tablet Take 100 mg by mouth daily. 08/30/23   [provider]  TRI-SPRINTEC 0.18/0.215/0.25 MG-35 MCG tablet Take 1 tablet by mouth daily. 05/06/22   [provider]    Family History Family History  Problem Relation Age of Onset   Diabetes Father     Social History Social History   Tobacco Use   Smoking status: Never   Smokeless tobacco: Never  Vaping Use   Vaping status: Never Used  Substance Use Topics   Alcohol use: Never   Drug use: Never     Allergies   Patient has no known allergies.   Review of Systems Review of Systems PER HPI  Physical Exam Triage Vital Signs ED Triage Vitals  Encounter Vitals Group     BP 10/30/23 0916 108/72     Systolic BP Percentile --      Diastolic BP Percentile --      Pulse Rate 10/30/23 0916  76     Resp 10/30/23 0916 20     Temp 10/30/23 0916 98.5 F (36.9 C)     Temp Source 10/30/23 0916 Oral     SpO2 10/30/23 0916 96 %     Weight --      Height --      Head Circumference --      Peak Flow --      Pain Score 10/30/23 0917 6     Pain Loc --      Pain Education --      Exclude from Growth Chart --    No data found.  Updated Vital Signs BP 108/72 (BP Location: Right Arm)   Pulse 76   Temp 98.5 F (36.9 C) (Oral)   Resp 20   LMP 10/04/2023   SpO2 96%   Visual Acuity Right Eye Distance:   Left Eye Distance:   Bilateral Distance:    Right Eye Near:   Left Eye Near:    Bilateral Near:     Physical Exam Vitals and nursing  note reviewed.  Constitutional:      Appearance: Normal appearance.  HENT:     Head: Atraumatic.     Right Ear: Tympanic membrane and external ear normal.     Left Ear: Tympanic membrane and external ear normal.     Nose: Rhinorrhea present.     Mouth/Throat:     Mouth: Mucous membranes are moist.     Pharynx: Posterior oropharyngeal erythema present.  Eyes:     Extraocular Movements: Extraocular movements intact.     Conjunctiva/sclera: Conjunctivae normal.  Cardiovascular:     Rate and Rhythm: Normal rate and regular rhythm.     Heart sounds: Normal heart sounds.  Pulmonary:     Effort: Pulmonary effort is normal.     Breath sounds: Normal breath sounds. No wheezing or rales.  Musculoskeletal:        General: Normal range of motion.     Cervical back: Normal range of motion and neck supple.  Skin:    General: Skin is warm and dry.  Neurological:     Mental Status: She is alert and oriented to person, place, and time.  Psychiatric:        Mood and Affect: Mood normal.        Thought Content: Thought content normal.    UC Treatments / Results  Labs (all labs ordered are listed, but only abnormal results are displayed) Labs Reviewed  POCT RAPID STREP A (OFFICE)  POC COVID19/FLU A&B COMBO    EKG   Radiology No results found.  Procedures Procedures (including critical care time)  Medications Ordered in UC Medications - No data to display  Initial Impression / Assessment and Plan / UC Course  I have reviewed the triage vital signs and the nursing notes.  Pertinent labs & imaging results that were available during my care of the patient were reviewed by me and considered in my medical decision making (see chart for details).     Vitals and exam overall reassuring today, rapid flu, strep and COVID all negative.  Discussed suspicion for viral respiratory infection.  Treat with viscous lidocaine, Phenergan DM, Flonase, Zofran for symptomatic management.  Discussed  supportive home care and return precautions.  Final Clinical Impressions(s) / UC Diagnoses   Final diagnoses:  Viral URI with cough   Discharge Instructions   None    ED Prescriptions     Medication Sig Dispense Auth. Provider  promethazine-dextromethorphan (PROMETHAZINE-DM) 6.25-15 MG/5ML syrup Take 5 mLs by mouth 4 (four) times daily as needed. 100 mL Particia Nearing, PA-C   lidocaine (XYLOCAINE) 2 % solution Use as directed 10 mLs in the mouth or throat every 3 (three) hours as needed. 100 mL Particia Nearing, PA-C   fluticasone [facility]) 50 MCG/ACT nasal spray Place 1 spray into both nostrils 2 (two) times daily. 16 g Particia Nearing, PA-C   ondansetron (ZOFRAN-ODT) 4 MG disintegrating tablet Take 1 tablet (4 mg total) by mouth every 8 (eight) hours as needed for nausea or vomiting. 20 tablet Particia Nearing, [place]      PDMP not reviewed this encounter.   Particia Nearing, [place] 10/30/23 1027

## 2023-10-30 NOTE — ED Triage Notes (Signed)
Pt reports she has had a sore throat, bilateral ear pain, fatigue, and nausea x 3 days

## 2023-12-23 ENCOUNTER — Other Ambulatory Visit: Payer: Self-pay | Admitting: Family Medicine

## 2024-10-27 ENCOUNTER — Telehealth
